# Patient Record
Sex: Female | Born: 1985 | Race: Black or African American | Hispanic: No | Marital: Single | Smoking: Never smoker
Health system: Southern US, Community
[De-identification: ages and names within clinical notes are randomized; demographics above are authoritative.]

## PROBLEM LIST (undated history)

## (undated) DIAGNOSIS — J45909 Unspecified asthma, uncomplicated: Secondary | ICD-10-CM

## (undated) HISTORY — DX: Unspecified asthma, uncomplicated: J45.909

---

## 2008-12-08 ENCOUNTER — Emergency Department (HOSPITAL_COMMUNITY): Admission: EM | Admit: 2008-12-08 | Discharge: 2008-12-09 | Payer: Self-pay | Admitting: Emergency Medicine

## 2009-01-18 ENCOUNTER — Emergency Department (HOSPITAL_COMMUNITY): Admission: EM | Admit: 2009-01-18 | Discharge: 2009-01-18 | Payer: Self-pay | Admitting: Emergency Medicine

## 2010-11-04 LAB — RAPID STREP SCREEN (MED CTR MEBANE ONLY): Streptococcus, Group A Screen (Direct): NEGATIVE

## 2010-11-12 ENCOUNTER — Emergency Department (HOSPITAL_COMMUNITY)
Admission: EM | Admit: 2010-11-12 | Discharge: 2010-11-12 | Disposition: A | Discharge status: Home / Self Care | Payer: Self-pay | Attending: Emergency Medicine | Admitting: Emergency Medicine

## 2010-11-12 ENCOUNTER — Emergency Department (HOSPITAL_COMMUNITY): Payer: Self-pay

## 2010-11-12 DIAGNOSIS — R000 Tachycardia, unspecified: Secondary | ICD-10-CM | POA: Insufficient documentation

## 2010-11-12 DIAGNOSIS — R059 Cough, unspecified: Secondary | ICD-10-CM | POA: Insufficient documentation

## 2010-11-12 DIAGNOSIS — R05 Cough: Secondary | ICD-10-CM | POA: Insufficient documentation

## 2010-11-12 DIAGNOSIS — IMO0001 Reserved for inherently not codable concepts without codable children: Secondary | ICD-10-CM | POA: Insufficient documentation

## 2010-11-12 DIAGNOSIS — J40 Bronchitis, not specified as acute or chronic: Secondary | ICD-10-CM | POA: Insufficient documentation

## 2014-05-13 ENCOUNTER — Ambulatory Visit (INDEPENDENT_AMBULATORY_CARE_PROVIDER_SITE_OTHER): Payer: BC Managed Care – PPO

## 2014-05-13 ENCOUNTER — Ambulatory Visit (INDEPENDENT_AMBULATORY_CARE_PROVIDER_SITE_OTHER): Payer: BC Managed Care – PPO | Admitting: Family Medicine

## 2014-05-13 VITALS — BP 132/79 | HR 85 | Temp 99.0°F | Resp 16 | Ht 68.0 in | Wt 194.2 lb

## 2014-05-13 DIAGNOSIS — R059 Cough, unspecified: Secondary | ICD-10-CM

## 2014-05-13 DIAGNOSIS — J45901 Unspecified asthma with (acute) exacerbation: Secondary | ICD-10-CM

## 2014-05-13 DIAGNOSIS — R05 Cough: Secondary | ICD-10-CM

## 2014-05-13 DIAGNOSIS — R062 Wheezing: Secondary | ICD-10-CM

## 2014-05-13 MED ORDER — ALBUTEROL SULFATE HFA 108 (90 BASE) MCG/ACT IN AERS
1.0000 | INHALATION_SPRAY | RESPIRATORY_TRACT | Status: DC | PRN
Start: 2014-05-13 — End: 2017-06-30

## 2014-05-13 MED ORDER — ALBUTEROL SULFATE (2.5 MG/3ML) 0.083% IN NEBU
2.5000 mg | INHALATION_SOLUTION | Freq: Once | RESPIRATORY_TRACT | Status: AC
  Administered 2014-05-13: 2.5 mg via RESPIRATORY_TRACT

## 2014-05-13 MED ORDER — PREDNISONE 20 MG PO TABS: 20.0000 mg | ORAL_TABLET | Freq: Every day | ORAL | Status: DC

## 2014-05-13 MED ORDER — AZITHROMYCIN 250 MG PO TABS: ORAL_TABLET | ORAL | Status: DC

## 2014-05-13 NOTE — Patient Instructions (Signed)
Start prednisone, zpak as discussed, and albuterol up to every 4 hours as needed. Return to the clinic or go to the nearest emergency room if any of your symptoms worsen or new symptoms occur.  Acute Bronchitis Bronchitis is inflammation of the airways that extend from the windpipe into the lungs (bronchi). The inflammation often causes mucus to develop. This leads to a cough, which is the most common symptom of bronchitis.  In acute bronchitis, the condition usually develops suddenly and goes away over time, usually in a couple weeks. Smoking, allergies, and asthma can make bronchitis worse. Repeated episodes of bronchitis may cause further lung problems.  CAUSES Acute bronchitis is most often caused by the same virus that causes a cold. The virus can spread from person to person (contagious) through coughing, sneezing, and touching contaminated objects. SIGNS AND SYMPTOMS   Cough.   Fever.   Coughing up mucus.   Body aches.   Chest congestion.   Chills.   Shortness of breath.   Sore throat.  DIAGNOSIS  Acute bronchitis is usually diagnosed through a physical exam. Your health care provider will also ask you questions about your medical history. Tests, such as chest X-rays, are sometimes done to rule out other conditions.  TREATMENT  Acute bronchitis usually goes away in a couple weeks. Oftentimes, no medical treatment is necessary. Medicines are sometimes given for relief of fever or cough. Antibiotic medicines are usually not needed but may be prescribed in certain situations. In some cases, an inhaler may be recommended to help reduce shortness of breath and control the cough. A cool mist vaporizer may also be used to help thin bronchial secretions and make it easier to clear the chest.  HOME CARE INSTRUCTIONS  Get plenty of rest.   Drink enough fluids to keep your urine clear or pale yellow (unless you have a medical condition that requires fluid restriction). Increasing  fluids may help thin your respiratory secretions (sputum) and reduce chest congestion, and it will prevent dehydration.   Take medicines only as directed by your health care provider.  If you were prescribed an antibiotic medicine, finish it all even if you start to feel better.  Avoid smoking and secondhand smoke. Exposure to cigarette smoke or irritating chemicals will make bronchitis worse. If you are a smoker, consider using nicotine gum or skin patches to help control withdrawal symptoms. Quitting smoking will help your lungs heal faster.   Reduce the chances of another bout of acute bronchitis by washing your hands frequently, avoiding people with cold symptoms, and trying not to touch your hands to your mouth, nose, or eyes.   Keep all follow-up visits as directed by your health care provider.  SEEK MEDICAL CARE IF: Your symptoms do not improve after 1 week of treatment.  SEEK IMMEDIATE MEDICAL CARE IF:  You develop an increased fever or chills.   You have chest pain.   You have severe shortness of breath.  You have bloody sputum.   You develop dehydration.  You faint or repeatedly feel like you are going to pass out.  You develop repeated vomiting.  You develop a severe headache. MAKE SURE YOU:   Understand these instructions.  Will watch your condition.  Will get help right away if you are not doing well or get worse. Document Released: 08/21/2004 Document Revised: 11/28/2013 Document Reviewed: 01/04/2013 [facility] Patient Information 2015 [place], [place]. This information is not intended to replace advice given to you by your health care provider. Make  sure you discuss any questions you have with your health care provider. Asthma Asthma is a recurring condition in which the airways tighten and narrow. Asthma can make it difficult to breathe. It can cause coughing, wheezing, and shortness of breath. Asthma episodes, also called asthma attacks, range from minor  to life-threatening. Asthma cannot be cured, but medicines and lifestyle changes can help control it. CAUSES Asthma is believed to be caused by inherited (genetic) and environmental factors, but its exact cause is unknown. Asthma may be triggered by allergens, lung infections, or irritants in the air. Asthma triggers are different for each person. Common triggers include:   Animal dander.  Dust mites.  Cockroaches.  Pollen from trees or grass.  Mold.  Smoke.  Air pollutants such as dust, household cleaners, hair sprays, aerosol sprays, paint fumes, strong chemicals, or strong odors.  Cold air, weather changes, and winds (which increase molds and pollens in the air).  Strong emotional expressions such as crying or laughing hard.  Stress.  Certain medicines (such as aspirin) or types of drugs (such as beta-blockers).  Sulfites in foods and drinks. Foods and drinks that may contain sulfites include dried fruit, potato chips, and sparkling grape juice.  Infections or inflammatory conditions such as the flu, a cold, or an inflammation of the nasal membranes (rhinitis).  Gastroesophageal reflux disease (GERD).  Exercise or strenuous activity. SYMPTOMS Symptoms may occur immediately after asthma is triggered or many hours later. Symptoms include:  Wheezing.  Excessive nighttime or early morning coughing.  Frequent or severe coughing with a common cold.  Chest tightness.  Shortness of breath. DIAGNOSIS  The diagnosis of asthma is made by a review of your medical history and a physical exam. Tests may also be performed. These may include:  Lung function studies. These tests show how much air you breathe in and out.  Allergy tests.  Imaging tests such as X-rays. TREATMENT  Asthma cannot be cured, but it can usually be controlled. Treatment involves identifying and avoiding your asthma triggers. It also involves medicines. There are 2 classes of medicine used for asthma  treatment:   Controller medicines. These prevent asthma symptoms from occurring. They are usually taken every day.  Reliever or rescue medicines. These quickly relieve asthma symptoms. They are used as needed and provide short-term relief. Your health care provider will help you create an asthma action plan. An asthma action plan is a written plan for managing and treating your asthma attacks. It includes a list of your asthma triggers and how they may be avoided. It also includes information on when medicines should be taken and when their dosage should be changed. An action plan may also involve the use of a device called a peak flow meter. A peak flow meter measures how well the lungs are working. It helps you monitor your condition. HOME CARE INSTRUCTIONS   Take medicines only as directed by your health care provider. Speak with your health care provider if you have questions about how or when to take the medicines.  Use a peak flow meter as directed by your health care provider. Record and keep track of readings.  Understand and use the action plan to help minimize or stop an asthma attack without needing to seek medical care.  Control your home environment in the following ways to help prevent asthma attacks:  Do not smoke. Avoid being exposed to secondhand smoke.  Change your heating and air conditioning filter regularly.  Limit your use of  fireplaces and wood stoves.  Get rid of pests (such as roaches and mice) and their droppings.  Throw away plants if you see mold on them.  Clean your floors and dust regularly. Use unscented cleaning products.  Try to have someone else vacuum for you regularly. Stay out of rooms while they are being vacuumed and for a short while afterward. If you vacuum, use a dust mask from a hardware store, a double-layered or microfilter vacuum cleaner bag, or a vacuum cleaner with a HEPA filter.  Replace carpet with wood, tile, or vinyl flooring. Carpet  can trap dander and dust.  Use allergy-proof pillows, mattress covers, and box spring covers.  Wash bed sheets and blankets every week in hot water and dry them in a dryer.  Use blankets that are made of polyester or cotton.  Clean bathrooms and kitchens with bleach. If possible, have someone repaint the walls in these rooms with mold-resistant paint. Keep out of the rooms that are being cleaned and painted.  Wash hands frequently. SEEK MEDICAL CARE IF:   You have wheezing, shortness of breath, or a cough even if taking medicine to prevent attacks.  The colored mucus you cough up (sputum) is thicker than usual.  Your sputum changes from clear or white to yellow, green, gray, or bloody.  You have any problems that may be related to the medicines you are taking (such as a rash, itching, swelling, or trouble breathing).  You are using a reliever medicine more than 2-3 times per week.  Your peak flow is still at 50-79% of your personal best after following your action plan for 1 hour.  You have a fever. SEEK IMMEDIATE MEDICAL CARE IF:   You seem to be getting worse and are unresponsive to treatment during an asthma attack.  You are short of breath even at rest.  You get short of breath when doing very little physical activity.  You have difficulty eating, drinking, or talking due to asthma symptoms.  You develop chest pain.  You develop a fast heartbeat.  You have a bluish color to your lips or fingernails.  You are light-headed, dizzy, or faint.  Your peak flow is less than 50% of your personal best. MAKE SURE YOU:   Understand these instructions.  Will watch your condition.  Will get help right away if you are not doing well or get worse. Document Released: 07/14/2005 Document Revised: 11/28/2013 Document Reviewed: 02/10/2013 [facility] Patient Information 2015 [place], [place]. This information is not intended to replace advice given to you by your health care  provider. Make sure you discuss any questions you have with your health care provider.

## 2014-05-13 NOTE — Progress Notes (Signed)
Subjective:  This chart was scribed for Stacy Staggers, MD by Jarvis Morgan, Medical Scribe. This patient was seen in Room 1 and the patient's care was started at 1:40 PM.    Patient ID: Stacy Shepard, female    DOB: 07-31-1985, 28 y.o.   MRN: [id number]  HPI HPI Comments: Stacy Shepard is a 28 y.o. female who presents to the [facility] complaining moderate, constant, congestion for 3 weeks. Pt states that the symptoms began to develop into a cough that has been gradually worsening. She states she has associated wheezing, shortness of breath, subjective fever, and chest wall and abdominal pain due to cough. She also had one episode of night sweats about 3 weeks ago. 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 No recent travel abroad. Pt has a h/o asthma and but states her albuterol inhaler prescription is not UTD.  She has not had any asthma symptoms in over 1 year. She used an albuterol inhaler about 4 hours ago for her symptoms. Checked her own BS several weeks ago and was at 99, No h/o DM or elevated BS. She denies any otalgia, nausea, vomiting, diarrhea.    There are no active problems to display for this patient.  Past Medical History  Diagnosis Date  . Asthma    History reviewed. No pertinent past surgical history. No Known Allergies Prior to Admission medications   Medication Sig Start Date End Date Taking? Authorizing Provider  Cholecalciferol (VITAMIN D-3) 5000 UNITS TABS Take 1 tablet by mouth daily.   Yes Historical Provider, MD   History   Social History  . Marital Status: Single    Spouse Name: N/A    Number of Children: N/A  . Years of Education: N/A   Occupational History  . Not on file.   Social History Main Topics  . Smoking status: Never Smoker   . Smokeless tobacco: Never Used  . Alcohol Use: 0.5 oz/week    1 drink(s) per week  . Drug Use: No  . Sexual Activity: Not on file   Other  Topics Concern  . Not on file   Social History Narrative  . No narrative on file    Review of Systems  Constitutional: Positive for fever (subjective) and diaphoresis (at night, 3 weeks ago; now resolved). Negative for chills.  HENT: Positive for congestion. Negative for ear pain.   Respiratory: Positive for cough, shortness of breath and wheezing.   Gastrointestinal: Positive for abdominal pain (due to cough). Negative for nausea, vomiting and diarrhea.  Musculoskeletal: Positive for myalgias.  All other systems reviewed and are negative.      Objective:   Physical Exam  Vitals reviewed. Constitutional: She is oriented to person, place, and time. She appears well-developed and well-nourished. No distress.  HENT:  Head: Normocephalic and atraumatic.  Right Ear: Hearing, tympanic membrane, external ear and ear canal normal.  Left Ear: Hearing, tympanic membrane, external ear and ear canal normal.  Nose: Nose normal.  Mouth/Throat: Oropharynx is clear and moist. No oropharyngeal exudate.  Eyes: Conjunctivae and EOM are normal. Pupils are equal, round, and reactive to light.  Cardiovascular: Normal rate, regular rhythm, normal heart sounds and intact distal pulses.   No murmur heard. Pulmonary/Chest: Effort normal. No respiratory distress. She has wheezes. She has no rhonchi.  Faint wheeze with slight decreased air flow. Speaking normal, no respiratory distress. No tachypnea   Neurological: She is alert  and oriented to person, place, and time.  Skin: Skin is warm and dry. No rash noted.  Psychiatric: She has a normal mood and affect. Her behavior is normal.     Filed Vitals:   05/13/14 1257  BP: 132/79  Pulse: 85  Temp: 99 F (37.2 C)  TempSrc: Oral  Resp: 16  Height: 5\' 8"  (1.727 m)  Weight: 194 lb 4 oz (88.111 kg)  SpO2: 97%   [facility] reading (PRIMARY) by  Dr. Neva Seat CXR no acute findings, slightly hyperinflated  Initial peak flow 270; post neb peak flow (2.5 mg  albuterol neb) was 460 with resolution of wheeze, lungs were clear with normal effort     Assessment & Plan:   [name] is a 28 y.o. female Cough - Plan: albuterol (PROVENTIL) (2.5 MG/3ML) 0.083% nebulizer solution 2.5 mg, DG Chest 2 View  Wheezing - Plan: albuterol (PROVENTIL) (2.5 MG/3ML) 0.083% nebulizer solution 2.5 mg, DG Chest 2 View  Asthmatic bronchitis, unspecified asthma severity, with acute exacerbation - Plan: albuterol (PROVENTIL) (2.5 MG/3ML) 0.083% nebulizer solution 2.5 mg, DG Chest 2 View, predniSONE (DELTASONE) 20 MG tablet, azithromycin (ZITHROMAX) 250 MG tablet, albuterol (PROVENTIL HFA;VENTOLIN HFA) 108 (90 BASE) MCG/ACT inhaler   initial URI with progression to LRTI/asthmatic bronchitis. Afebrile, reassuring CXR.   -prednisone 40mg  QD for 5 days.  Sed, rtc precautions.   -zpak #1  -albuterol inh Q4-6h prn #1Rx  - RTC/Er precautions.     Meds ordered this encounter  Medications  . Cholecalciferol (VITAMIN D-3) 5000 UNITS TABS    Sig: Take 1 tablet by mouth daily.  Marland Kitchen albuterol (PROVENTIL) (2.5 MG/3ML) 0.083% nebulizer solution 2.5 mg    Sig:   . predniSONE (DELTASONE) 20 MG tablet    Sig: Take 1 tablet (20 mg total) by mouth daily with breakfast.    Dispense:  10 tablet    Refill:  0  . azithromycin (ZITHROMAX) 250 MG tablet    Sig: Take 2 pills by mouth on day 1, then 1 pill by mouth per day on days 2 through 5.    Dispense:  6 tablet    Refill:  0  . albuterol (PROVENTIL HFA;VENTOLIN HFA) 108 (90 BASE) MCG/ACT inhaler    Sig: Inhale 1-2 puffs into the lungs every 4 (four) hours as needed for wheezing or shortness of breath.    Dispense:  1 Inhaler    Refill:  0   Patient Instructions  Start prednisone, zpak as discussed, and albuterol up to every 4 hours as needed. Return to the clinic or go to the nearest emergency room if any of your symptoms worsen or new symptoms occur.  Acute Bronchitis Bronchitis is inflammation of the airways that  extend from the windpipe into the lungs (bronchi). The inflammation often causes mucus to develop. This leads to a cough, which is the most common symptom of bronchitis.  In acute bronchitis, the condition usually develops suddenly and goes away over time, usually in a couple weeks. Smoking, allergies, and asthma can make bronchitis worse. Repeated episodes of bronchitis may cause further lung problems.  CAUSES Acute bronchitis is most often caused by the same virus that causes a cold. The virus can spread from person to person (contagious) through coughing, sneezing, and touching contaminated objects. SIGNS AND SYMPTOMS   Cough.   Fever.   Coughing up mucus.   Body aches.   Chest congestion.   Chills.   Shortness of breath.   Sore throat.  DIAGNOSIS  Acute bronchitis is usually diagnosed through a physical exam. Your health care provider will also ask you questions about your medical history. Tests, such as chest X-rays, are sometimes done to rule out other conditions.  TREATMENT  Acute bronchitis usually goes away in a couple weeks. Oftentimes, no medical treatment is necessary. Medicines are sometimes given for relief of fever or cough. Antibiotic medicines are usually not needed but may be prescribed in certain situations. In some cases, an inhaler may be recommended to help reduce shortness of breath and control the cough. A cool mist vaporizer may also be used to help thin bronchial secretions and make it easier to clear the chest.  HOME CARE INSTRUCTIONS  Get plenty of rest.   Drink enough fluids to keep your urine clear or pale yellow (unless you have a medical condition that requires fluid restriction). Increasing fluids may help thin your respiratory secretions (sputum) and reduce chest congestion, and it will prevent dehydration.   Take medicines only as directed by your health care provider.  If you were prescribed an antibiotic medicine, finish it all even if  you start to feel better.  Avoid smoking and secondhand smoke. Exposure to cigarette smoke or irritating chemicals will make bronchitis worse. If you are a smoker, consider using nicotine gum or skin patches to help control withdrawal symptoms. Quitting smoking will help your lungs heal faster.   Reduce the chances of another bout of acute bronchitis by washing your hands frequently, avoiding people with cold symptoms, and trying not to touch your hands to your mouth, nose, or eyes.   Keep all follow-up visits as directed by your health care provider.  SEEK MEDICAL CARE IF: Your symptoms do not improve after 1 week of treatment.  SEEK IMMEDIATE MEDICAL CARE IF:  You develop an increased fever or chills.   You have chest pain.   You have severe shortness of breath.  You have bloody sputum.   You develop dehydration.  You faint or repeatedly feel like you are going to pass out.  You develop repeated vomiting.  You develop a severe headache. MAKE SURE YOU:   Understand these instructions.  Will watch your condition.  Will get help right away if you are not doing well or get worse. Document Released: 08/21/2004 Document Revised: 11/28/2013 Document Reviewed: 01/04/2013 [facility] Patient Information 2015 [place], [place]. This information is not intended to replace advice given to you by your health care provider. Make sure you discuss any questions you have with your health care provider. Asthma Asthma is a recurring condition in which the airways tighten and narrow. Asthma can make it difficult to breathe. It can cause coughing, wheezing, and shortness of breath. Asthma episodes, also called asthma attacks, range from minor to life-threatening. Asthma cannot be cured, but medicines and lifestyle changes can help control it. CAUSES Asthma is believed to be caused by inherited (genetic) and environmental factors, but its exact cause is unknown. Asthma may be triggered by  allergens, lung infections, or irritants in the air. Asthma triggers are different for each person. Common triggers include:   Animal dander.  Dust mites.  Cockroaches.  Pollen from trees or grass.  Mold.  Smoke.  Air pollutants such as dust, household cleaners, hair sprays, aerosol sprays, paint fumes, strong chemicals, or strong odors.  Cold air, weather changes, and winds (which increase molds and pollens in the air).  Strong emotional expressions such as crying or laughing hard.  Stress.  Certain medicines (such as  aspirin) or types of drugs (such as beta-blockers).  Sulfites in foods and drinks. Foods and drinks that may contain sulfites include dried fruit, potato chips, and sparkling grape juice.  Infections or inflammatory conditions such as the flu, a cold, or an inflammation of the nasal membranes (rhinitis).  Gastroesophageal reflux disease (GERD).  Exercise or strenuous activity. SYMPTOMS Symptoms may occur immediately after asthma is triggered or many hours later. Symptoms include:  Wheezing.  Excessive nighttime or early morning coughing.  Frequent or severe coughing with a common cold.  Chest tightness.  Shortness of breath. DIAGNOSIS  The diagnosis of asthma is made by a review of your medical history and a physical exam. Tests may also be performed. These may include:  Lung function studies. These tests show how much air you breathe in and out.  Allergy tests.  Imaging tests such as X-rays. TREATMENT  Asthma cannot be cured, but it can usually be controlled. Treatment involves identifying and avoiding your asthma triggers. It also involves medicines. There are 2 classes of medicine used for asthma treatment:   Controller medicines. These prevent asthma symptoms from occurring. They are usually taken every day.  Reliever or rescue medicines. These quickly relieve asthma symptoms. They are used as needed and provide short-term relief. Your  health care provider will help you create an asthma action plan. An asthma action plan is a written plan for managing and treating your asthma attacks. It includes a list of your asthma triggers and how they may be avoided. It also includes information on when medicines should be taken and when their dosage should be changed. An action plan may also involve the use of a device called a peak flow meter. A peak flow meter measures how well the lungs are working. It helps you monitor your condition. HOME CARE INSTRUCTIONS   Take medicines only as directed by your health care provider. Speak with your health care provider if you have questions about how or when to take the medicines.  Use a peak flow meter as directed by your health care provider. Record and keep track of readings.  Understand and use the action plan to help minimize or stop an asthma attack without needing to seek medical care.  Control your home environment in the following ways to help prevent asthma attacks:  Do not smoke. Avoid being exposed to secondhand smoke.  Change your heating and air conditioning filter regularly.  Limit your use of fireplaces and wood stoves.  Get rid of pests (such as roaches and mice) and their droppings.  Throw away plants if you see mold on them.  Clean your floors and dust regularly. Use unscented cleaning products.  Try to have someone else vacuum for you regularly. Stay out of rooms while they are being vacuumed and for a short while afterward. If you vacuum, use a dust mask from a hardware store, a double-layered or microfilter vacuum cleaner bag, or a vacuum cleaner with a HEPA filter.  Replace carpet with wood, tile, or vinyl flooring. Carpet can trap dander and dust.  Use allergy-proof pillows, mattress covers, and box spring covers.  Wash bed sheets and blankets every week in hot water and dry them in a dryer.  Use blankets that are made of polyester or cotton.  Clean bathrooms  and kitchens with bleach. If possible, have someone repaint the walls in these rooms with mold-resistant paint. Keep out of the rooms that are being cleaned and painted.  Wash hands frequently. SEEK  MEDICAL CARE IF:   You have wheezing, shortness of breath, or a cough even if taking medicine to prevent attacks.  The colored mucus you cough up (sputum) is thicker than usual.  Your sputum changes from clear or white to yellow, green, gray, or bloody.  You have any problems that may be related to the medicines you are taking (such as a rash, itching, swelling, or trouble breathing).  You are using a reliever medicine more than 2-3 times per week.  Your peak flow is still at 50-79% of your personal best after following your action plan for 1 hour.  You have a fever. SEEK IMMEDIATE MEDICAL CARE IF:   You seem to be getting worse and are unresponsive to treatment during an asthma attack.  You are short of breath even at rest.  You get short of breath when doing very little physical activity.  You have difficulty eating, drinking, or talking due to asthma symptoms.  You develop chest pain.  You develop a fast heartbeat.  You have a bluish color to your lips or fingernails.  You are light-headed, dizzy, or faint.  Your peak flow is less than 50% of your personal best. MAKE SURE YOU:   Understand these instructions.  Will watch your condition.  Will get help right away if you are not doing well or get worse. Document Released: 07/14/2005 Document Revised: 11/28/2013 Document Reviewed: 02/10/2013 [facility] Patient Information 2015 [place], [place]. This information is not intended to replace advice given to you by your health care provider. Make sure you discuss any questions you have with your health care provider.   I personally performed the services described in this documentation, which was scribed in my presence. The recorded information has been reviewed and considered, and  addended by me as needed.

## 2016-01-20 IMAGING — CR DG CHEST 2V
2 series · 2 of 2 positions shown · non-contrast
Comparison: 11/12/2010

CLINICAL DATA: Complaining of moderate, constant, congestion for 3
weeks. Pt states that the symptoms began to develop into a cough
that has been gradually worsening. She states she has associated
wheezing, shortness of breath, subjective fever, and chest wall and
abdominal pain due to cough.

EXAM:
CHEST  2 VIEW

[PA]
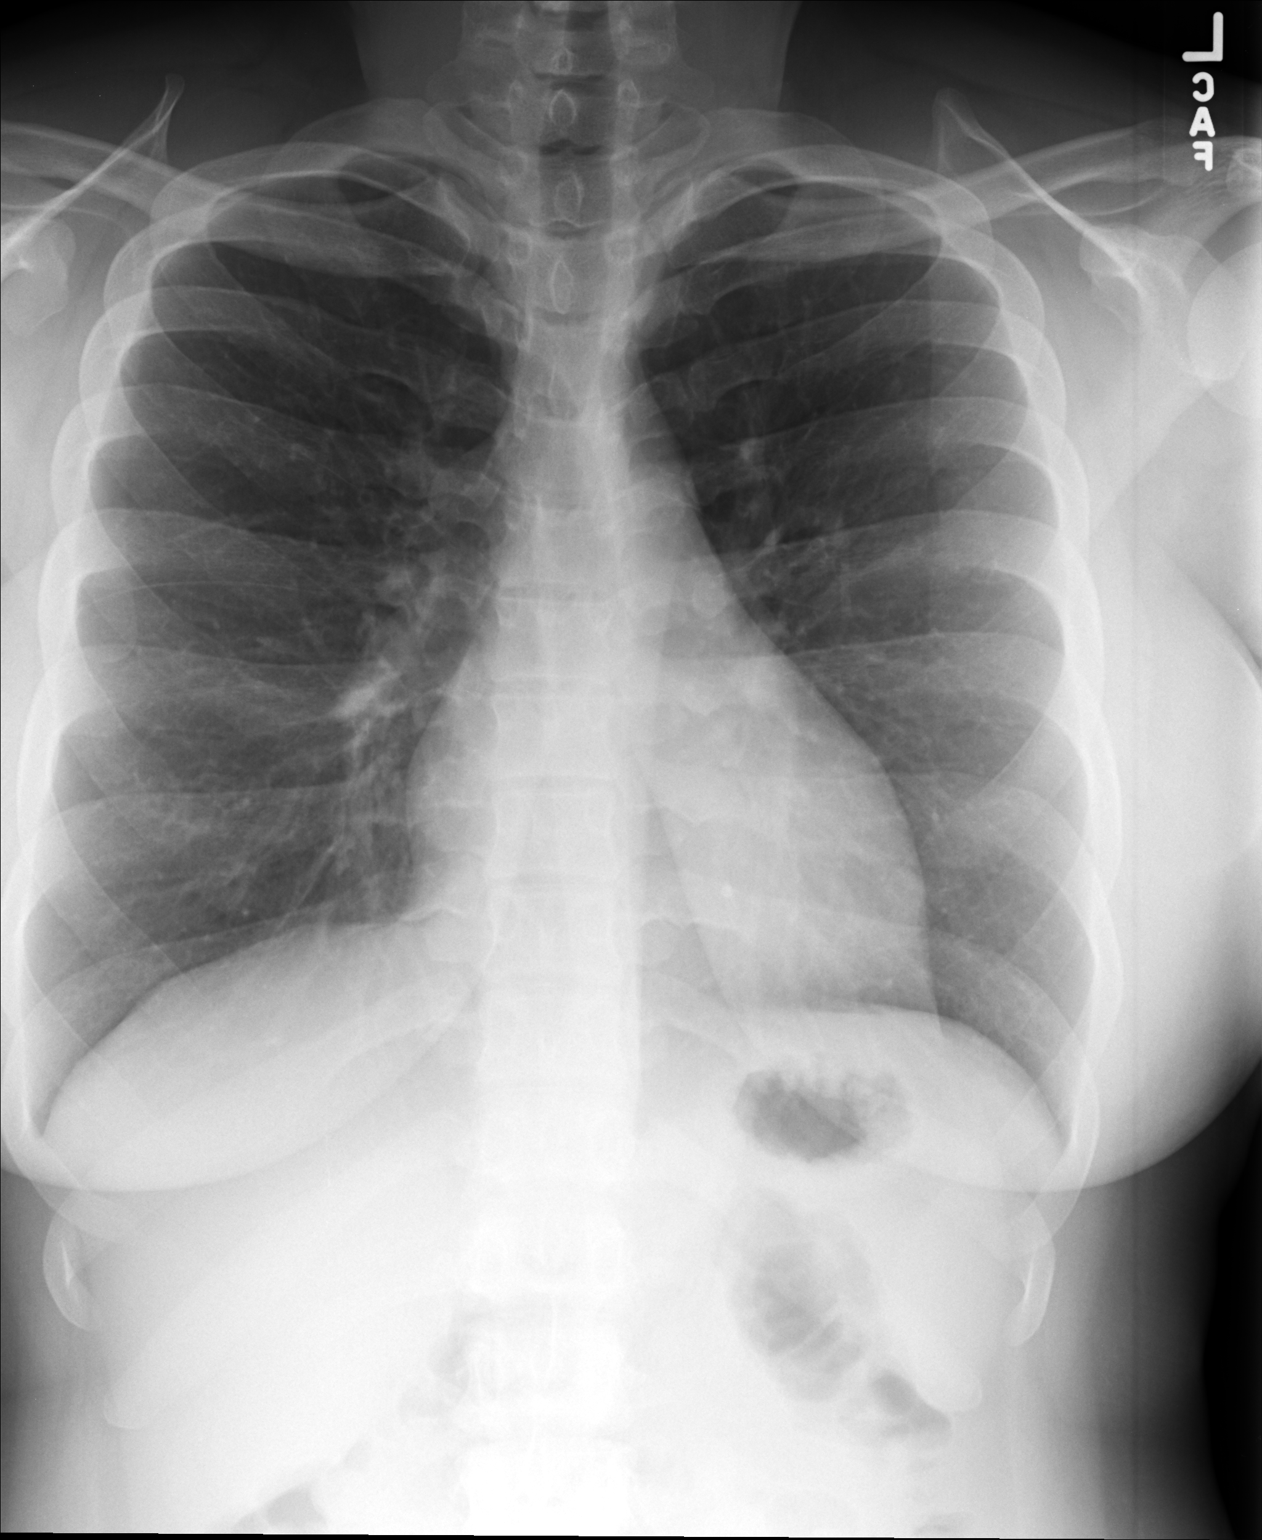

[lateral]
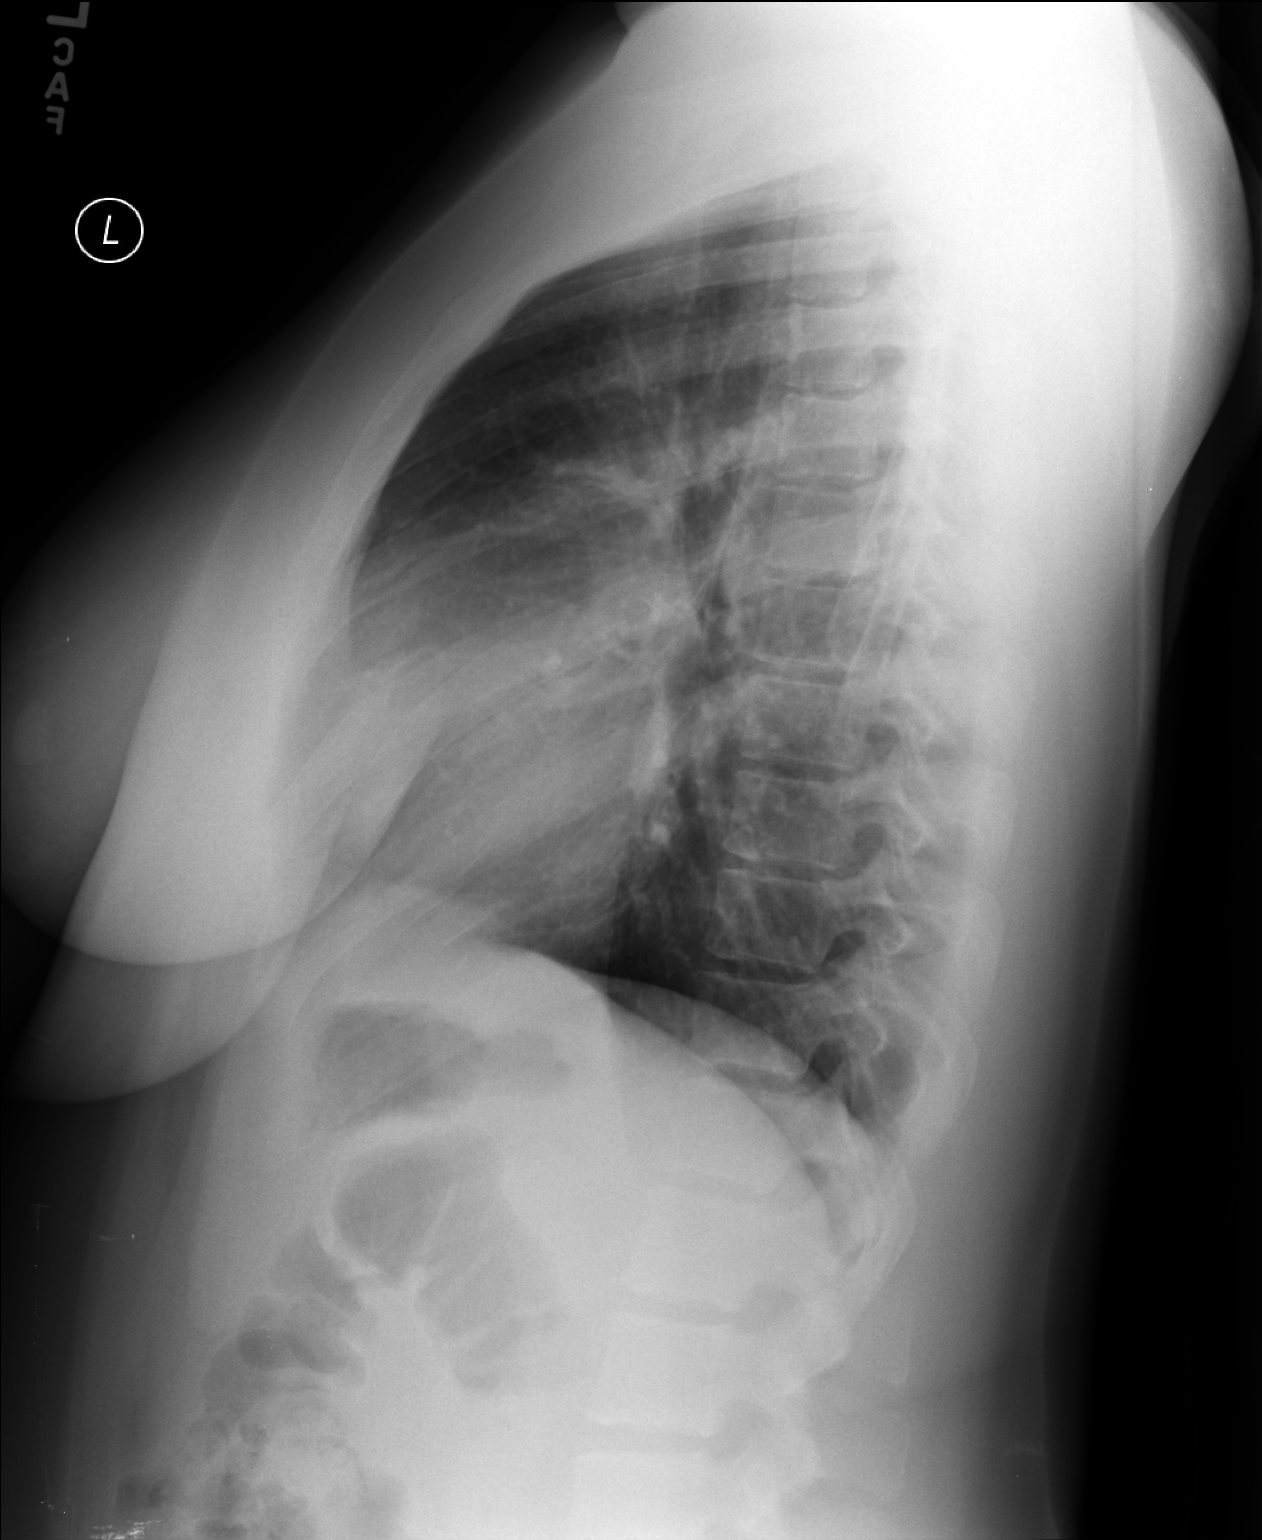

[2 of 2 positions shown; findings below may reference images not displayed]

FINDINGS: The heart size and mediastinal contours are within normal limits.
Both lungs are clear. The visualized skeletal structures are
unremarkable.
IMPRESSION: No active cardiopulmonary disease.

## 2016-02-03 ENCOUNTER — Encounter (HOSPITAL_BASED_OUTPATIENT_CLINIC_OR_DEPARTMENT_OTHER): Payer: Self-pay | Admitting: Emergency Medicine

## 2016-02-03 ENCOUNTER — Emergency Department (HOSPITAL_BASED_OUTPATIENT_CLINIC_OR_DEPARTMENT_OTHER)
Admission: EM | Admit: 2016-02-03 | Discharge: 2016-02-03 | Disposition: A | Discharge status: Home / Self Care | Payer: BLUE CROSS/BLUE SHIELD | Attending: Emergency Medicine | Admitting: Emergency Medicine

## 2016-02-03 DIAGNOSIS — H579 Unspecified disorder of eye and adnexa: Secondary | ICD-10-CM | POA: Diagnosis present

## 2016-02-03 DIAGNOSIS — H109 Unspecified conjunctivitis: Secondary | ICD-10-CM

## 2016-02-03 DIAGNOSIS — J45909 Unspecified asthma, uncomplicated: Secondary | ICD-10-CM | POA: Diagnosis not present

## 2016-02-03 MED ORDER — ERYTHROMYCIN 5 MG/GM OP OINT: TOPICAL_OINTMENT | Freq: Three times a day (TID) | OPHTHALMIC | Status: DC

## 2016-02-03 MED ORDER — LORATADINE 10 MG PO TABS
10.0000 mg | ORAL_TABLET | Freq: Once | ORAL | Status: AC
  Administered 2016-02-03: 10 mg via ORAL
  Filled 2016-02-03: qty 1

## 2016-02-03 MED ORDER — ERYTHROMYCIN 5 MG/GM OP OINT
TOPICAL_OINTMENT | Freq: Once | OPHTHALMIC | Status: AC
  Administered 2016-02-03: 22:00:00 via OPHTHALMIC
  Filled 2016-02-03: qty 3.5

## 2016-02-03 MED ORDER — HYDROCODONE-ACETAMINOPHEN 5-325 MG PO TABS: 1.0000 | ORAL_TABLET | Freq: Four times a day (QID) | ORAL | Status: DC | PRN

## 2016-02-03 MED ORDER — FLUORESCEIN SODIUM 1 MG OP STRP
ORAL_STRIP | OPHTHALMIC | Status: AC
  Administered 2016-02-03: 22:00:00
  Filled 2016-02-03: qty 1

## 2016-02-03 MED ORDER — LORATADINE 10 MG PO TABS: 10.0000 mg | ORAL_TABLET | Freq: Every day | ORAL | Status: DC

## 2016-02-03 MED ORDER — TETRACAINE HCL 0.5 % OP SOLN
2.0000 [drp] | Freq: Once | OPHTHALMIC | Status: AC
  Administered 2016-02-03: 2 [drp] via OPHTHALMIC
  Filled 2016-02-03: qty 4

## 2016-02-03 MED ORDER — FLUORESCEIN SODIUM 1 MG OP STRP
2.0000 | ORAL_STRIP | Freq: Once | OPHTHALMIC | Status: AC
  Administered 2016-02-03: 2 via OPHTHALMIC
  Filled 2016-02-03: qty 2

## 2016-02-03 NOTE — ED Notes (Signed)
Pt given d/c instructions as per chart. Rx x 3 with narc and tylenol prec. Verbalizes understanding. No questions.

## 2016-02-03 NOTE — ED Notes (Signed)
Cool wet washcloth given to patient to place across eyes for comfort while she rests.  Awaiting further EDP eval.

## 2016-02-03 NOTE — ED Provider Notes (Signed)
CSN: [id number]     Arrival date & time 02/03/16  1904 History  By signing my name below, I, Doreatha Martin, attest that this documentation has been prepared under the direction and in the presence of Marily Memos, MD. Electronically Signed: Doreatha Martin, ED Scribe. 02/03/2016. 7:55 PM.     Chief Complaint  Patient presents with  . Eye Problem   The history is provided by the patient. No language interpreter was used.   HPI Comments: Stacy Shepard is a 30 y.o. female who presents to the Emergency Department complaining of moderate right eye redness onset 3 days ago. Per pt, she picked an eyelash out of her right eye with tweezers before the onset of the redness and drainage in her right eye. Pt reports that the redness and drainage has currently spread to bilateral eyes. She denies recent trauma or injury to the eyes. She states that she was prescribed Cipro eye drops 2 days ago from her PCP that have provided no relief of the redness and minimal relief of the discharge. Per pt, after beginning the Cipro, she began to have right jaw paresthesia and stiffness that is painful when eating. Pt states that this paresthesia and stiffness has also spread to her left jaw today. Pt does not wear contact lenses. She denies visual changes, trouble swallowing. PCP is with [facility].   Past Medical History  Diagnosis Date  . Asthma    History reviewed. No pertinent past surgical history. Family History  Problem Relation Age of Onset  . Cancer Mother   . Diabetes Mother   . Hypertension Mother   . Hypertension Sister   . Cancer Brother   . Stroke Maternal Grandmother   . Hypertension Maternal Grandfather    Social History  Substance Use Topics  . Smoking status: Never Smoker   . Smokeless tobacco: Never Used  . Alcohol Use: 0.5 oz/week    1 drink(s) per week   OB History    No data available     Review of Systems  HENT: Negative for trouble swallowing.        +jaw stiffness  Eyes:  Positive for discharge and redness. Negative for visual disturbance.  All other systems reviewed and are negative.  Allergies  Review of patient's allergies indicates no known allergies.  Home Medications   Prior to Admission medications   Medication Sig Start Date End Date Taking? Authorizing Provider  albuterol (PROVENTIL HFA;VENTOLIN HFA) 108 (90 BASE) MCG/ACT inhaler Inhale 1-2 puffs into the lungs every 4 (four) hours as needed for wheezing or shortness of breath. 05/13/14   Shade Flood, MD  azithromycin (ZITHROMAX) 250 MG tablet Take 2 pills by mouth on day 1, then 1 pill by mouth per day on days 2 through 5. 05/13/14   Shade Flood, MD  Cholecalciferol (VITAMIN D-3) 5000 UNITS TABS Take 1 tablet by mouth daily.    Historical Provider, MD  erythromycin ophthalmic ointment Place into both eyes 3 (three) times daily. 02/03/16   Marily Memos, MD  HYDROcodone-acetaminophen (NORCO) 5-325 MG tablet Take 1-2 tablets by mouth every 6 (six) hours as needed for severe pain. 02/03/16   Marily Memos, MD  loratadine (CLARITIN) 10 MG tablet Take 1 tablet (10 mg total) by mouth daily. 02/03/16   Marily Memos, MD  predniSONE (DELTASONE) 20 MG tablet Take 1 tablet (20 mg total) by mouth daily with breakfast. 05/13/14   Shade Flood, MD   BP 135/89 mmHg  Pulse  72  Temp(Src) 99.1 F (37.3 C) (Oral)  Resp 18  Ht 5\' 7"  (1.702 m)  Wt 175 lb (79.379 kg)  BMI 27.40 kg/m2  SpO2 100%  LMP 01/20/2016 Physical Exam  Constitutional: She appears well-developed and well-nourished.  HENT:  Head: Normocephalic.  Eyes: Conjunctivae are normal.  Bilateral IOP 14  Cardiovascular: Normal rate.   Pulmonary/Chest: Effort normal. No respiratory distress.  Abdominal: She exhibits no distension.  Musculoskeletal: Normal range of motion.  Neurological: She is alert.  Skin: Skin is warm and dry.  Psychiatric: She has a normal mood and affect. Her behavior is normal.  Nursing note and vitals  reviewed.   ED Course  Procedures (including critical care time) DIAGNOSTIC STUDIES: Oxygen Saturation is 99% on RA, normal by my interpretation.    COORDINATION OF CARE: 7:52 PM Discussed treatment plan with pt at bedside which includes ophthalmology f/u and pt agreed to plan.   Labs Review Labs Reviewed - No data to display  Imaging Review No results found. I have personally reviewed and evaluated these images and lab results as part of my medical decision-making.   EKG Interpretation None      MDM   Final diagnoses:  Bilateral conjunctivitis    Bilateral conjunctivitis think that is either allergic or viral. Leg is less likely to be bacterial however we will switch over to erythromycin. No evidence of significant large abrasions on with plan. Tono-Pen is normal. No vision loss. Plan for close ophthalmology follow-up.  New Prescriptions: Discharge Medication List as of 02/03/2016  9:35 PM    START taking these medications   Details  erythromycin ophthalmic ointment Place into both eyes 3 (three) times daily., Starting 02/03/2016, Until Discontinued, Print    HYDROcodone-acetaminophen (NORCO) 5-325 MG tablet Take 1-2 tablets by mouth every 6 (six) hours as needed for severe pain., Starting 02/03/2016, Until Discontinued, Print    loratadine (CLARITIN) 10 MG tablet Take 1 tablet (10 mg total) by mouth daily., Starting 02/03/2016, Until Discontinued, Print         I have personally and contemperaneously reviewed labs and imaging and used in my decision making as above.   A medical screening exam was performed and I feel the patient has had an appropriate workup for their chief complaint at this time and likelihood of emergent condition existing is low and thus workup can continue on an outpatient basis.. Their vital signs are stable. They have been counseled on decision, discharge, follow up and which symptoms necessitate immediate return to the emergency department.  They  verbally stated understanding and agreement with plan and discharged in stable condition.    I personally performed the services described in this documentation, which was scribed in my presence. The recorded information has been reviewed and is accurate.    Marily MemosJason Pasha Gadison, MD 02/04/16 [phone]

## 2016-02-03 NOTE — ED Notes (Signed)
Pt in c/o bilateral eye redness and irritation. Treated with cipro drops without improvement. Ambulatory in NAD.

## 2016-05-30 ENCOUNTER — Ambulatory Visit (INDEPENDENT_AMBULATORY_CARE_PROVIDER_SITE_OTHER): Payer: BLUE CROSS/BLUE SHIELD | Admitting: Internal Medicine

## 2016-05-30 ENCOUNTER — Encounter: Payer: Self-pay | Admitting: Internal Medicine

## 2016-05-30 VITALS — BP 122/82 | HR 107 | Ht 68.5 in | Wt 177.0 lb

## 2016-05-30 DIAGNOSIS — E0590 Thyrotoxicosis, unspecified without thyrotoxic crisis or storm: Secondary | ICD-10-CM

## 2016-05-30 DIAGNOSIS — R5383 Other fatigue: Secondary | ICD-10-CM | POA: Diagnosis not present

## 2016-05-30 LAB — T4, FREE: Free T4: 0.85 ng/dL (ref 0.60–1.60)

## 2016-05-30 LAB — VITAMIN B12: VITAMIN B 12: 596 pg/mL (ref 211–911)

## 2016-05-30 LAB — T3, FREE: T3, Free: 5.3 pg/mL — ABNORMAL HIGH (ref 2.3–4.2)

## 2016-05-30 LAB — TSH: TSH: 0.78 u[IU]/mL (ref 0.35–4.50)

## 2016-05-30 NOTE — Progress Notes (Signed)
Patient ID: Stacy Shepard, female   DOB: 1985-08-03, 30 y.o.   MRN: [id number]    HPI  Stacy Shepard is a 30 y.o.-year-old female, referred by her PCP, Jarrett Sohoourtney Wharton, PA , for evaluation and management of  subclinical thyrotoxicosis.  She developed increased fatigue in the last year. She is working different shifts.   I reviewed pt's thyroid tests: 03/07/2016: TSH 0.28 (0.34-4.5), free T4 0.86 (0.61-1.12), total T3 143 (71-180), TPO antibodies 15 (0-34) 03/07/2014: TSH 0.57  Pt denies feeling nodules in neck, hoarseness, dysphagia/odynophagia, SOB with lying down; she c/o: - + fatigue - + Subjective hyperthermia, which is not new - no tremors - no anxiety - no palpitations - no hyperdefecation - no weight loss - no hair loss  She exercises 4 times a week: Cardio, weights. No decreased exercise tolerance.  Pt does not have a FH of thyroid ds. No FH of thyroid cancer. No h/o radiation tx to head or neck.  No seaweed or kelp, no recent contrast studies. No recent steroid use - last: 5 day taper beginning of the year. No herbal supplements. No Biotin use - was taking it >1 year ago. She is now vegan for last 2 month.  At the beginning of the year, she went to the weight loss clinic: B12 inj., Phentermine. Stopped these after 3 month.  Pt. also has a history of vitamin D def. (last 01/2016: 24.3), lipoma, asthma as a child.  ROS: Constitutional: See history of present illness Eyes: no blurry vision, no xerophthalmia ENT: no sore throat, no nodules palpated in neck, no dysphagia/odynophagia, no hoarseness Cardiovascular: no CP/SOB/palpitations/leg swelling Respiratory: no cough/SOB Gastrointestinal: no N/V/D/C Musculoskeletal: no muscle/joint aches Skin: no rashes Neurological: no tremors/numbness/tingling/dizziness Psychiatric: no depression/anxiety  Past Medical History:  Diagnosis Date  . Asthma    No past surgical history on file. Social History   Social  History  . Marital status: Single    Spouse name: N/A  . Number of children: 0   Occupational History  . LPN    Social History Main Topics  . Smoking status: Never Smoker  . Smokeless tobacco: Never Used  . Alcohol use 0.5 oz/week    1 drink(s) per week  . Drug use: No   Current Outpatient Prescriptions on File Prior to Visit  Medication Sig Dispense Refill  . albuterol (PROVENTIL HFA;VENTOLIN HFA) 108 (90 BASE) MCG/ACT inhaler Inhale 1-2 puffs into the lungs every 4 (four) hours as needed for wheezing or shortness of breath. 1 Inhaler 0  . azithromycin (ZITHROMAX) 250 MG tablet Take 2 pills by mouth on day 1, then 1 pill by mouth per day on days 2 through 5. (Patient not taking: Reported on 05/30/2016) 6 tablet 0  . Cholecalciferol (VITAMIN D-3) 5000 UNITS TABS Take 1 tablet by mouth daily.    Marland Kitchen. erythromycin ophthalmic ointment Place into both eyes 3 (three) times daily. (Patient not taking: Reported on 05/30/2016) 3.5 g 1  . HYDROcodone-acetaminophen (NORCO) 5-325 MG tablet Take 1-2 tablets by mouth every 6 (six) hours as needed for severe pain. (Patient not taking: Reported on 05/30/2016) 10 tablet 0  . loratadine (CLARITIN) 10 MG tablet Take 1 tablet (10 mg total) by mouth daily. (Patient not taking: Reported on 05/30/2016) 30 tablet 0  . predniSONE (DELTASONE) 20 MG tablet Take 1 tablet (20 mg total) by mouth daily with breakfast. (Patient not taking: Reported on 05/30/2016) 10 tablet 0   No current facility-administered medications on file prior to  visit.    No Known Allergies Family History  Problem Relation Age of Onset  . Cancer Mother   . Diabetes Mother   . Hypertension Mother   . Hypertension Sister   . Cancer Brother   . Stroke Maternal Grandmother   . Hypertension Maternal Grandfather     PE: BP 122/82 (BP Location: Left Arm, Patient Position: Sitting)   Pulse (!) 107   Ht 5' 8.5" (1.74 m)   Wt 177 lb (80.3 kg)   LMP 05/11/2016   SpO2 99%   BMI 26.52 kg/m  Wt  Readings from Last 3 Encounters:  05/30/16 177 lb (80.3 kg)  02/03/16 175 lb (79.4 kg)  05/13/14 194 lb 4 oz (88.1 kg)   Constitutional: overweight, in NAD Eyes: PERRLA, EOMI, no exophthalmos, no lid lag, no stare ENT: moist mucous membranes, + Slight symmetric thyromegaly, no thyroid bruits, no cervical lymphadenopathy Cardiovascular: RRR, No MRG Respiratory: CTA B Gastrointestinal: abdomen soft, NT, ND, BS+ Musculoskeletal: no deformities, strength intact in all 4 Skin: moist, warm, no rashes Neurological: no tremor with outstretched hands, DTR normal in all 4  ASSESSMENT: 1. Mild subclinical thyrotoxicosis  2. Fatigue  PLAN:  1. Patient with a recently found low TSH, without thyrotoxic sxs: No weight loss, increased heat intolerance (she has a mild degree of this, but not new), hyperdefecation, palpitations, anxiety. She is fatigued, though, and wondering whether this is related to her abnormal thyroid tests. I explained that she has a mild degree of subclinical thyrotoxicosis, which is unlikely to cause significant fatigue, but not impossible.  - she does not appear to have exogenous causes for the low TSH.  - We discussed that possible causes of thyrotoxicosis are:  Graves ds   Thyroiditis toxic multinodular goiter/ toxic adenoma (I cannot feel nodules at palpation of her thyroid). - I suggested that we check the TSH, fT3 and fT4 and also add thyroid stimulating antibodies to screen for Graves' disease.  - If the tests remain abnormal, we may need an uptake and scan to differentiate between the 3 above possible etiologies  - we discussed about possible modalities of treatment for the above conditions, to include methimazole use, radioactive iodine ablation or (last resort) surgery. - we might need to do thyroid ultrasound depending on the results of the uptake and scan (if a cold nodule is present) - She is tachycardic today, but she just drank an energy drink - I advised her to  join my chart to communicate easier - RTC in 4 months, but likely sooner for repeat labs  2. Fatigue - She has a history of vitamin D deficiency, which improved, but not to normal, at last check 4 months ago. She was started on 2000 units vitamin D daily. - She is a vegan for the last 2 months. We'll check a B12 level. She has been on B12 injections at the beginning of the year for weight loss, apparently.  Component     Latest Ref Rng & Units 05/30/2016  T4,Free(Direct)     0.60 - 1.60 ng/dL 1.610.85  Triiodothyronine,Free,Serum     2.3 - 4.2 pg/mL 5.3 (H)  TSH     0.35 - 4.50 uIU/mL 0.78  TSI     <140 % baseline 154 (H)  Vitamin B12     211 - 911 pg/mL 596   Normal TSH and free T4 was slightly elevated free T3. TSI's only mildly elevated. This picture could be consistent with resolving thyroiditis. No intervention is  needed for now, however, I would like to repeat her TFTs in 2 months. B12 vitamin normal.  Carlus Pavlov, MD PhD [facility]

## 2016-05-30 NOTE — Patient Instructions (Signed)
Please stop at the lab.  Please come back for a follow-up appointment in 4 months.   

## 2016-06-04 LAB — THYROID STIMULATING IMMUNOGLOBULIN: TSI: 154 %{baseline} — AB (ref <140)

## 2016-09-26 ENCOUNTER — Telehealth: Payer: Self-pay | Admitting: Internal Medicine

## 2016-09-26 ENCOUNTER — Ambulatory Visit: Payer: BLUE CROSS/BLUE SHIELD | Admitting: Internal Medicine

## 2016-09-26 NOTE — Telephone Encounter (Signed)
3mo

## 2016-09-26 NOTE — Telephone Encounter (Signed)
Patient no showed today's appt. Please advise on how to follow up. °A. No follow up necessary. °B. Follow up urgent. Contact patient immediately. °C. Follow up necessary. Contact patient and schedule visit in ___ days. °D. Follow up advised. Contact patient and schedule visit in ____weeks. ° °

## 2016-09-29 NOTE — Telephone Encounter (Signed)
Called patient to reschedule appt

## 2016-11-14 ENCOUNTER — Ambulatory Visit (INDEPENDENT_AMBULATORY_CARE_PROVIDER_SITE_OTHER): Payer: BLUE CROSS/BLUE SHIELD | Admitting: Family Medicine

## 2016-11-14 ENCOUNTER — Encounter: Payer: Self-pay | Admitting: Family Medicine

## 2016-11-14 VITALS — BP 122/60 | HR 70 | Temp 99.0°F | Resp 18 | Ht 68.0 in | Wt 181.8 lb

## 2016-11-14 DIAGNOSIS — Z1389 Encounter for screening for other disorder: Secondary | ICD-10-CM | POA: Diagnosis not present

## 2016-11-14 DIAGNOSIS — Z130 Encounter for screening for diseases of the blood and blood-forming organs and certain disorders involving the immune mechanism: Secondary | ICD-10-CM | POA: Diagnosis not present

## 2016-11-14 DIAGNOSIS — Z0000 Encounter for general adult medical examination without abnormal findings: Secondary | ICD-10-CM

## 2016-11-14 DIAGNOSIS — Z113 Encounter for screening for infections with a predominantly sexual mode of transmission: Secondary | ICD-10-CM

## 2016-11-14 DIAGNOSIS — E0590 Thyrotoxicosis, unspecified without thyrotoxic crisis or storm: Secondary | ICD-10-CM | POA: Diagnosis not present

## 2016-11-14 DIAGNOSIS — Z136 Encounter for screening for cardiovascular disorders: Secondary | ICD-10-CM

## 2016-11-14 DIAGNOSIS — Z1383 Encounter for screening for respiratory disorder NEC: Secondary | ICD-10-CM | POA: Diagnosis not present

## 2016-11-14 LAB — POCT URINALYSIS DIP (MANUAL ENTRY)
Bilirubin, UA: NEGATIVE
Blood, UA: NEGATIVE
Glucose, UA: NEGATIVE mg/dL
Ketones, POC UA: NEGATIVE mg/dL
Nitrite, UA: NEGATIVE
Spec Grav, UA: >=1.03 — AB (ref 1.010–1.025)
Urobilinogen, UA: 1 E.U./dL
pH, UA: 6 (ref 5.0–8.0)

## 2016-11-14 LAB — POC MICROSCOPIC URINALYSIS (UMFC): Mucus: ABSENT

## 2016-11-14 NOTE — Progress Notes (Signed)
Subjective:  By signing my name below, I, Stacy Shepard, attest that this documentation has been prepared under the direction and in the presence of Stacy Sorenson, MD Electronically Signed: Charline Bills, ED Scribe 11/14/2016 at 8:49 AM.   Patient ID: Stacy Shepard, female    DOB: [birth year], 31 y.o.   MRN: [id number]  Chief Complaint  Patient presents with  . Employment Physical   HPI  Stacy Shepard is a 31 y.o. female who presents to [facility] Care at [facility] for an employment physical. Pt is fasting at this visit. PCP is with [facility] on [address]. Pt has a h/o mild overactive thyroid and recently discovered she was 6.[redacted] weeks pregnant. She last had labs drawn in 06/2016; states her thyroid levels had improved by changing her diet. Pt denies nausea or vomiting in the mornings. She has noticed some intermittent white vaginal discharge for a while but denies vaginal itching. She reports occasioanl breast tenderness but denies discharge or new lumps. She is taking a prenatal vitamin. Pt's last tetanus is unknown.  Past Medical History:  Diagnosis Date  . Asthma    Current Outpatient Prescriptions on File Prior to Visit  Medication Sig Dispense Refill  . albuterol (PROVENTIL HFA;VENTOLIN HFA) 108 (90 BASE) MCG/ACT inhaler Inhale 1-2 puffs into the lungs every 4 (four) hours as needed for wheezing or shortness of breath. 1 Inhaler 0   No current facility-administered medications on file prior to visit.    No Known Allergies  No past surgical history on file. Family History  Problem Relation Age of Onset  . Cancer Mother   . Diabetes Mother   . Hypertension Mother   . Hypertension Sister   . Cancer Brother   . Stroke Maternal Grandmother   . Hypertension Maternal Grandfather    Social History   Social History  . Marital status: Single    Spouse name: N/A  . Number of children: N/A  . Years of education: N/A   Social History Main Topics  . Smoking status:  Never Smoker  . Smokeless tobacco: Never Used  . Alcohol use 0.5 oz/week    1 drink(s) per week  . Drug use: No  . Sexual activity: Not Asked   Other Topics Concern  . None   Social History Narrative  . None   Depression screen [facility] 2/9 11/14/2016  Decreased Interest 0  Down, Depressed, Hopeless 0  PHQ - 2 Score 0    There is no immunization history on file for this patient.  Review of Systems  Respiratory: Positive for wheezing.   Gastrointestinal: Negative for nausea and vomiting.  Allergic/Immunologic: Positive for environmental allergies.  All other systems reviewed and are negative.     Objective:   Physical Exam  Constitutional: She is oriented to person, place, and time. She appears well-developed and well-nourished. No distress.  HENT:  Head: Normocephalic and atraumatic.  Right Ear: Tympanic membrane normal.  Left Ear: Tympanic membrane normal.  Nose: Nose normal.  Mouth/Throat: Oropharynx is clear and moist.  Eyes: Conjunctivae and EOM are normal.  Neck: Neck supple. No tracheal deviation present.  Thyroid feels symmetrically enlarged  Cardiovascular: Normal rate, regular rhythm, S1 normal, S2 normal and normal heart sounds.   Pulses:      Dorsalis pedis pulses are 2+ on the right side, and 2+ on the left side.  Pulmonary/Chest: Effort normal and breath sounds normal. No respiratory distress.  Abdominal: Bowel sounds are normal.  Musculoskeletal: Normal range  of motion.  No LE edema.  Lymphadenopathy:    She has no cervical adenopathy.  Neurological: She is alert and oriented to person, place, and time.  Skin: Skin is warm and dry.  Psychiatric: She has a normal mood and affect. Her behavior is normal.  Nursing note and vitals reviewed.  BP 122/60   Pulse 70   Temp 99 F (37.2 C) (Oral)   Resp 18   Ht  (1.727 m)   Wt 181 lb 12.8 oz (82.5 kg)   LMP 05/11/2016   SpO2 100%   BMI 27.64 kg/m    Results for orders placed or performed in visit  on 11/14/16  POCT urinalysis dipstick  Result Value Ref Range   Color, UA yellow yellow   Clarity, UA cloudy (A) clear   Glucose, UA negative negative mg/dL   Bilirubin, UA negative negative   Ketones, POC UA negative negative mg/dL   Spec Grav, UA >=1.027 (A) 1.010 - 1.025   Blood, UA negative negative   pH, UA 6.0 5.0 - 8.0   Protein Ur, POC trace (A) negative mg/dL   Urobilinogen, UA 1.0 0.2 or 1.0 E.U./dL   Nitrite, UA Negative Negative   Leukocytes, UA Trace (A) Negative  POCT Microscopic Urinalysis ([facility])  Result Value Ref Range   WBC,UR,HPF,POC Few (A) None WBC/hpf   RBC,UR,HPF,POC None None RBC/hpf   Bacteria Many (A) None, Too numerous to count   Mucus Absent Absent   Epithelial Cells, UR Per Microscopy Moderate (A) None, Too numerous to count cells/hpf      Assessment & Plan:   1. Annual physical exam   2. Routine screening for STI (sexually transmitted infection)   3. Screening for cardiovascular, respiratory, and genitourinary diseases   4. Screening for deficiency anemia   5. Subclinical hyperthyroidism     Orders Placed This Encounter  Procedures  . GC/Chlamydia Probe Amp  . Thyroid Panel With TSH  . CBC  . Comprehensive metabolic panel    Order Specific Question:   Has the patient fasted?    Answer:   Yes  . Lipid panel    Order Specific Question:   Has the patient fasted?    Answer:   Yes  . HIV antibody  . HCV Ab w/Rflx to Verification  . RPR  . Interpretation:  . Care order/instruction:    Scheduling Instructions:     Complete orders, AVS and go.  Marland Kitchen POCT urinalysis dipstick  . POCT Microscopic Urinalysis ([facility])    Meds ordered this encounter  Medications  . Prenatal Vit-Fe Fumarate-FA (PRENATAL MULTIVITAMIN) TABS tablet    Sig: Take 1 tablet by mouth daily at 12 noon.    I personally performed the services described in this documentation, which was scribed in my presence. The recorded information has been reviewed and considered, and  addended by me as needed.   Stacy Shepard, M.D.  Primary Care at [facility] [address] [place], [place] [address] [phone] phone [phone] fax  11/23/16 10:35 PM

## 2016-11-14 NOTE — Patient Instructions (Addendum)
   IF you received an x-ray today, you will receive an invoice from [facility]. Please contact [facility] at [phone] with questions or concerns regarding your invoice.   IF you received labwork today, you will receive an invoice from [facility]. Please contact [facility] at [phone] with questions or concerns regarding your invoice.   Our billing staff will not be able to assist you with questions regarding bills from these companies.  You will be contacted with the lab results as soon as they are available. The fastest way to get your results is to activate your My Chart account. Instructions are located on the last page of this paperwork. If you have not heard from us regarding the results in 2 weeks, please contact this office.     First Trimester of Pregnancy The first trimester of pregnancy is from week 1 until the end of week 13 (months 1 through 3). A week after a sperm fertilizes an egg, the egg will implant on the wall of the uterus. This embryo will begin to develop into a baby. Genes from you and your partner will form the baby. The female genes will determine whether the baby will be a boy or a girl. At 6-8 weeks, the eyes and face will be formed, and the heartbeat can be seen on ultrasound. At the end of 12 weeks, all the baby's organs will be formed. Now that you are pregnant, you will want to do everything you can to have a healthy baby. Two of the most important things are to get good prenatal care and to follow your health care provider's instructions. Prenatal care is all the medical care you receive before the baby's birth. This care will help prevent, find, and treat any problems during the pregnancy and childbirth. Body changes during your first trimester Your body goes through many changes during pregnancy. The changes vary from woman to woman.  You may gain or lose a couple of pounds at first.  You may feel sick to your stomach (nauseous) and you  may throw up (vomit). If the vomiting is uncontrollable, call your health care provider.  You may tire easily.  You may develop headaches that can be relieved by medicines. All medicines should be approved by your health care provider.  You may urinate more often. Painful urination may mean you have a bladder infection.  You may develop heartburn as a result of your pregnancy.  You may develop constipation because certain hormones are causing the muscles that push stool through your intestines to slow down.  You may develop hemorrhoids or swollen veins (varicose veins).  Your breasts may begin to grow larger and become tender. Your nipples may stick out more, and the tissue that surrounds them (areola) may become darker.  Your gums may bleed and may be sensitive to brushing and flossing.  Dark spots or blotches (chloasma, mask of pregnancy) may develop on your face. This will likely fade after the baby is born.  Your menstrual periods will stop.  You may have a loss of appetite.  You may develop cravings for certain kinds of food.  You may have changes in your emotions from day to day, such as being excited to be pregnant or being concerned that something may go wrong with the pregnancy and baby.  You may have more vivid and strange dreams.  You may have changes in your hair. These can include thickening of your hair, rapid growth, and changes in texture. Some women   also have hair loss during or after pregnancy, or hair that feels dry or thin. Your hair will most likely return to normal after your baby is born.  What to expect at prenatal visits During a routine prenatal visit:  You will be weighed to make sure you and the baby are growing normally.  Your blood pressure will be taken.  Your abdomen will be measured to track your baby's growth.  The fetal heartbeat will be listened to between weeks 10 and 14 of your pregnancy.  Test results from any previous visits will be  discussed.  Your health care provider may ask you:  How you are feeling.  If you are feeling the baby move.  If you have had any abnormal symptoms, such as leaking fluid, bleeding, severe headaches, or abdominal cramping.  If you are using any tobacco products, including cigarettes, chewing tobacco, and electronic cigarettes.  If you have any questions.  Other tests that may be performed during your first trimester include:  Blood tests to find your blood type and to check for the presence of any previous infections. The tests will also be used to check for low iron levels (anemia) and protein on red blood cells (Rh antibodies). Depending on your risk factors, or if you previously had diabetes during pregnancy, you may have tests to check for high blood sugar that affects pregnant women (gestational diabetes).  Urine tests to check for infections, diabetes, or protein in the urine.  An ultrasound to confirm the proper growth and development of the baby.  Fetal screens for spinal cord problems (spina bifida) and Down syndrome.  HIV (human immunodeficiency virus) testing. Routine prenatal testing includes screening for HIV, unless you choose not to have this test.  You may need other tests to make sure you and the baby are doing well.  Follow these instructions at home: Medicines  Follow your health care provider's instructions regarding medicine use. Specific medicines may be either safe or unsafe to take during pregnancy.  Take a prenatal vitamin that contains at least 600 micrograms (mcg) of folic acid.  If you develop constipation, try taking a stool softener if your health care provider approves. Eating and drinking  Eat a balanced diet that includes fresh fruits and vegetables, whole grains, good sources of protein such as meat, eggs, or tofu, and low-fat dairy. Your health care provider will help you determine the amount of weight gain that is right for you.  Avoid raw  meat and uncooked cheese. These carry germs that can cause birth defects in the baby.  Eating four or five small meals rather than three large meals a day may help relieve nausea and vomiting. If you start to feel nauseous, eating a few soda crackers can be helpful. Drinking liquids between meals, instead of during meals, also seems to help ease nausea and vomiting.  Limit foods that are high in fat and processed sugars, such as fried and sweet foods.  To prevent constipation: ? Eat foods that are high in fiber, such as fresh fruits and vegetables, whole grains, and beans. ? Drink enough fluid to keep your urine clear or pale yellow. Activity  Exercise only as directed by your health care provider. Most women can continue their usual exercise routine during pregnancy. Try to exercise for 30 minutes at least 5 days a week. Exercising will help you: ? Control your weight. ? Stay in shape. ? Be prepared for labor and delivery.  Experiencing pain or cramping in   the lower abdomen or lower back is a good sign that you should stop exercising. Check with your health care provider before continuing with normal exercises.  Try to avoid standing for long periods of time. Move your legs often if you must stand in one place for a long time.  Avoid heavy lifting.  Wear low-heeled shoes and practice good posture.  You may continue to have sex unless your health care provider tells you not to. Relieving pain and discomfort  Wear a good support bra to relieve breast tenderness.  Take warm sitz baths to soothe any pain or discomfort caused by hemorrhoids. Use hemorrhoid cream if your health care provider approves.  Rest with your legs elevated if you have leg cramps or low back pain.  If you develop varicose veins in your legs, wear support hose. Elevate your feet for 15 minutes, 3-4 times a day. Limit salt in your diet. Prenatal care  Schedule your prenatal visits by the twelfth week of pregnancy.  They are usually scheduled monthly at first, then more often in the last 2 months before delivery.  Write down your questions. Take them to your prenatal visits.  Keep all your prenatal visits as told by your health care provider. This is important. Safety  Wear your seat belt at all times when driving.  Make a list of emergency phone numbers, including numbers for family, friends, the hospital, and police and fire departments. General instructions  Ask your health care provider for a referral to a local prenatal education class. Begin classes no later than the beginning of month 6 of your pregnancy.  Ask for help if you have counseling or nutritional needs during pregnancy. Your health care provider can offer advice or refer you to specialists for help with various needs.  Do not use hot tubs, steam rooms, or saunas.  Do not douche or use tampons or scented sanitary pads.  Do not cross your legs for long periods of time.  Avoid cat litter boxes and soil used by cats. These carry germs that can cause birth defects in the baby and possibly loss of the fetus by miscarriage or stillbirth.  Avoid all smoking, herbs, alcohol, and medicines not prescribed by your health care provider. Chemicals in these products affect the formation and growth of the baby.  Do not use any products that contain nicotine or tobacco, such as cigarettes and e-cigarettes. If you need help quitting, ask your health care provider. You may receive counseling support and other resources to help you quit.  Schedule a dentist appointment. At home, brush your teeth with a soft toothbrush and be gentle when you floss. Contact a health care provider if:  You have dizziness.  You have mild pelvic cramps, pelvic pressure, or nagging pain in the abdominal area.  You have persistent nausea, vomiting, or diarrhea.  You have a bad smelling vaginal discharge.  You have pain when you urinate.  You notice increased  swelling in your face, hands, legs, or ankles.  You are exposed to fifth disease or chickenpox.  You are exposed to German measles (rubella) and have never had it. Get help right away if:  You have a fever.  You are leaking fluid from your vagina.  You have spotting or bleeding from your vagina.  You have severe abdominal cramping or pain.  You have rapid weight gain or loss.  You vomit blood or material that looks like coffee grounds.  You develop a severe headache.  You   have shortness of breath.  You have any kind of trauma, such as from a fall or a car accident. Summary  The first trimester of pregnancy is from week 1 until the end of week 13 (months 1 through 3).  Your body goes through many changes during pregnancy. The changes vary from woman to woman.  You will have routine prenatal visits. During those visits, your health care provider will examine you, discuss any test results you may have, and talk with you about how you are feeling. This information is not intended to replace advice given to you by your health care provider. Make sure you discuss any questions you have with your health care provider. Document Released: 07/08/2001 Document Revised: 06/25/2016 Document Reviewed: 06/25/2016 Elsevier Interactive Patient Education  2017 [employer]   

## 2016-11-15 LAB — HCV AB W/RFLX TO VERIFICATION: HCV Ab: <0.1 s/co ratio (ref 0.0–0.9)

## 2016-11-15 LAB — CBC
HEMATOCRIT: 31.9 % — AB (ref 34.0–46.6)
Hemoglobin: 11.2 g/dL (ref 11.1–15.9)
MCH: 24.2 pg — ABNORMAL LOW (ref 26.6–33.0)
MCHC: 35.1 g/dL (ref 31.5–35.7)
MCV: 69 fL — AB (ref 79–97)
Platelets: 219 10*3/uL (ref 150–379)
RBC: 4.63 x10E6/uL (ref 3.77–5.28)
RDW: 14.7 % (ref 12.3–15.4)
WBC: 5 10*3/uL (ref 3.4–10.8)

## 2016-11-15 LAB — COMPREHENSIVE METABOLIC PANEL
A/G RATIO: 1.6 (ref 1.2–2.2)
ALT: 13 IU/L (ref 0–32)
AST: 14 IU/L (ref 0–40)
Albumin: 4.4 g/dL (ref 3.5–5.5)
Alkaline Phosphatase: 23 IU/L — ABNORMAL LOW (ref 39–117)
BUN / CREAT RATIO: 14 (ref 9–23)
BUN: 9 mg/dL (ref 6–20)
Bilirubin Total: 1.4 mg/dL — ABNORMAL HIGH (ref 0.0–1.2)
CALCIUM: 9.5 mg/dL (ref 8.7–10.2)
CO2: 22 mmol/L (ref 18–29)
Chloride: 100 mmol/L (ref 96–106)
Creatinine, Ser: 0.66 mg/dL (ref 0.57–1.00)
GFR calc Af Amer: 139 mL/min/{1.73_m2} (ref >59)
GFR, EST NON AFRICAN AMERICAN: 121 mL/min/{1.73_m2} (ref >59)
GLOBULIN, TOTAL: 2.7 g/dL (ref 1.5–4.5)
Glucose: 71 mg/dL (ref 65–99)
POTASSIUM: 3.5 mmol/L (ref 3.5–5.2)
SODIUM: 136 mmol/L (ref 134–144)
TOTAL PROTEIN: 7.1 g/dL (ref 6.0–8.5)

## 2016-11-15 LAB — HIV ANTIBODY (ROUTINE TESTING W REFLEX): HIV Screen 4th Generation wRfx: NONREACTIVE

## 2016-11-15 LAB — THYROID PANEL WITH TSH
Free Thyroxine Index: 1.7 (ref 1.2–4.9)
T3 Uptake Ratio: 24 % (ref 24–39)
T4, Total: 6.9 ug/dL (ref 4.5–12.0)
TSH: 0.576 u[IU]/mL (ref 0.450–4.500)

## 2016-11-15 LAB — LIPID PANEL
CHOL/HDL RATIO: 3.4 ratio (ref 0.0–4.4)
Cholesterol, Total: 141 mg/dL (ref 100–199)
HDL: 41 mg/dL (ref >39)
LDL Calculated: 86 mg/dL (ref 0–99)
Triglycerides: 70 mg/dL (ref 0–149)
VLDL Cholesterol Cal: 14 mg/dL (ref 5–40)

## 2016-11-15 LAB — HCV INTERPRETATION

## 2016-11-15 LAB — RPR: RPR: NONREACTIVE

## 2016-11-16 LAB — GC/CHLAMYDIA PROBE AMP
Chlamydia trachomatis, NAA: NEGATIVE
NEISSERIA GONORRHOEAE BY PCR: NEGATIVE

## 2016-12-27 ENCOUNTER — Encounter (HOSPITAL_BASED_OUTPATIENT_CLINIC_OR_DEPARTMENT_OTHER): Payer: Self-pay | Admitting: Emergency Medicine

## 2016-12-27 ENCOUNTER — Ambulatory Visit: Payer: BLUE CROSS/BLUE SHIELD | Admitting: Physician Assistant

## 2016-12-27 ENCOUNTER — Emergency Department (HOSPITAL_BASED_OUTPATIENT_CLINIC_OR_DEPARTMENT_OTHER)
Admission: EM | Admit: 2016-12-27 | Discharge: 2016-12-27 | Disposition: A | Discharge status: Home / Self Care | Payer: BLUE CROSS/BLUE SHIELD | Attending: Emergency Medicine | Admitting: Emergency Medicine

## 2016-12-27 DIAGNOSIS — Z3A14 14 weeks gestation of pregnancy: Secondary | ICD-10-CM | POA: Insufficient documentation

## 2016-12-27 DIAGNOSIS — O219 Vomiting of pregnancy, unspecified: Secondary | ICD-10-CM | POA: Diagnosis not present

## 2016-12-27 DIAGNOSIS — O99512 Diseases of the respiratory system complicating pregnancy, second trimester: Secondary | ICD-10-CM | POA: Insufficient documentation

## 2016-12-27 DIAGNOSIS — R0981 Nasal congestion: Secondary | ICD-10-CM

## 2016-12-27 DIAGNOSIS — J45909 Unspecified asthma, uncomplicated: Secondary | ICD-10-CM

## 2016-12-27 MED ORDER — CETIRIZINE HCL 10 MG PO TABS: 10.0000 mg | ORAL_TABLET | Freq: Every day | ORAL | 0 refills | Status: DC

## 2016-12-27 MED ORDER — ALBUTEROL SULFATE HFA 108 (90 BASE) MCG/ACT IN AERS
2.0000 | INHALATION_SPRAY | Freq: Once | RESPIRATORY_TRACT | Status: AC
  Administered 2016-12-27: 2 via RESPIRATORY_TRACT
  Filled 2016-12-27: qty 6.7

## 2016-12-27 NOTE — ED Triage Notes (Signed)
Patient states that she is [redacted] weeks pregnant. Has had SOB at night for the last 2 -3 nights. Is out of her inhaler. Denies any pain at this time but reports that she has some at night when she is "wheezing" _ no distress noted in triage

## 2016-12-27 NOTE — Discharge Instructions (Signed)
Medications: Zyrtec, albuterol inhaler  Treatment: Take Zyrtec once daily for allergy symptoms. Use albuterol inhaler every 4-6 hours as needed for wheezing or shortness of breath.  Follow-up: Please follow-up with your primary care provider as needed if your symptoms are persisting. If you develop any new or worsening symptoms, such as fever over 100.4, severe cough, chest pain, or severe shortness of breath unresolved by inhaler, please return to emergency department as soon as possible.

## 2016-12-27 NOTE — ED Provider Notes (Signed)
[facility] Provider Note   CSN: [id number] Arrival date & time: 12/27/16  1614   By signing my name below, I, Stacy Shepard, attest that this documentation has been prepared under the direction and in the presence of Buel Ream, PA-C. Electronically Signed: Clarisse Shepard, Scribe. 12/27/16. 5:23 PM.   History   Chief Complaint Chief Complaint  Patient presents with  . Cough   The history is provided by the patient and medical records. No language interpreter was used.    Stacy Shepard is a 31 y.o. female with h/o asthma, [redacted] weeks pregnant presenting to the Emergency Department with chief complaint of gradually worsening congestion x 4 days. Wheezing and chest tightness onset 2 days ago worse at night. Pt reports chest "coolness" and cough only last night. She states this is consistent with h/o asthma. She notes rare asthma exacerbations; she states URI's and environmental allergens normally aggravate chronic asthma. She states she has run out of her inhaler at home. She has not taken any allergy medications at home. Pt vomiting d/t pregnancy but none otherwise. Regular prenatal visits with OB/GYN noted. No chest pain, sore throat or abdominal pain. No other complaints at this time.  Past Medical History:  Diagnosis Date  . Asthma     Patient Active Problem List   Diagnosis Date Noted  . 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 Fatigue 05/30/2016    History reviewed. No pertinent surgical history.  OB History    Gravida Para Term Preterm AB Living   1             SAB TAB Ectopic Multiple Live Births                   Home Medications    Prior to Admission medications   Medication Sig Start Date End Date Taking? Authorizing Provider  albuterol (PROVENTIL HFA;VENTOLIN HFA) 108 (90 BASE) MCG/ACT inhaler Inhale 1-2 puffs into the lungs every 4 (four) hours as needed for wheezing or shortness of breath. 05/13/14   Shade Flood, MD  cetirizine  (ZYRTEC ALLERGY) 10 MG tablet Take 1 tablet (10 mg total) by mouth daily. 12/27/16   Emi Holes, PA-C  Prenatal Vit-Fe Fumarate-FA (PRENATAL MULTIVITAMIN) TABS tablet Take 1 tablet by mouth daily at 12 noon.    [provider]    Family History Family History  Problem Relation Age of Onset  . Cancer Mother   . Diabetes Mother   . Hypertension Mother   . Hypertension Sister   . Cancer Brother   . Stroke Maternal Grandmother   . Hypertension Maternal Grandfather     Social History Social History  Substance Use Topics  . Smoking status: Never Smoker  . Smokeless tobacco: Never Used  . Alcohol use 0.5 oz/week    1 drink(s) per week     Allergies   Patient has no known allergies.   Review of Systems Review of Systems  HENT: Positive for congestion. Negative for sore throat.   Respiratory: Positive for cough, chest tightness and wheezing.   Cardiovascular: Negative for chest pain.  Gastrointestinal: Positive for nausea and vomiting. Negative for abdominal pain.  All other systems reviewed and are negative.    Physical Exam Updated Vital Signs BP (!) 114/98 (BP Location: Left Arm)   Pulse 89   Temp 98.1 F (36.7 C) (Oral)   Resp (!) 22   Ht 5\' 8"  (1.727 m)   Wt 180 lb (81.6 kg)  LMP 05/11/2016   SpO2 100%   BMI 27.37 kg/m   Physical Exam  Constitutional: She appears well-developed and well-nourished. No distress.  HENT:  Head: Normocephalic and atraumatic.  Right Ear: Tympanic membrane and external ear normal.  Left Ear: Tympanic membrane and external ear normal.  Mouth/Throat: Oropharynx is clear and moist. No oropharyngeal exudate.  Eyes: Conjunctivae are normal. Pupils are equal, round, and reactive to light. Right eye exhibits no discharge. Left eye exhibits no discharge. No scleral icterus.  Neck: Normal range of motion. Neck supple. No thyromegaly present.  Cardiovascular: Normal rate, regular rhythm, normal heart sounds and intact distal  pulses.  Exam reveals no gallop and no friction rub.   No murmur heard. Pulmonary/Chest: No stridor.  No audible wheezing. Decreased breath sounds at bases bilaterally.  Abdominal: Soft. Bowel sounds are normal. She exhibits no distension. There is no tenderness. There is no rebound and no guarding.  Musculoskeletal: She exhibits no edema.  Lymphadenopathy:    She has no cervical adenopathy.  Neurological: She is alert. Coordination normal.  Skin: Skin is warm and dry. No rash noted. She is not diaphoretic. No pallor.  Psychiatric: She has a normal mood and affect.  Nursing note and vitals reviewed.    ED Treatments / Results  DIAGNOSTIC STUDIES: Oxygen Saturation is 100% on RA, NL by my interpretation.    COORDINATION OF CARE: 5:10 PM-Discussed next steps with pt. Pt verbalized understanding and is agreeable with the plan. Will order/ Rx medications. Pt prepared for d/c, advised of symptomatic care at home and return precautions.    Labs (all labs ordered are listed, but only abnormal results are displayed) Labs Reviewed - No data to display  EKG  EKG Interpretation None       Radiology No results found.  Procedures Procedures (including critical care time)  Medications Ordered in ED Medications  albuterol (PROVENTIL HFA;VENTOLIN HFA) 108 (90 Base) MCG/ACT inhaler 2 puff (2 puffs Inhalation Given 12/27/16 1722)     Initial Impression / Assessment and Plan / ED Course  I have reviewed the triage vital signs and the nursing notes.  Pertinent labs & imaging results that were available during my care of the patient were reviewed by me and considered in my medical decision making (see chart for details).     Patient with mild signs and symptoms of asthma/RAD. Oxygen saturation at 100%. No accessory muscle use, no cyanosis, afebrile. Treated in the ED with inhaler and patient feels improved and back to baseline after treatment. Mild wheezing right base heard after  albuterol treatments, but breath sounds improved at bases after treatment. Considering patient is back to baseline, I feel patient is safe for discharge with Albuterol inhaler and Zyrtec. Pt instructed to follow up with PCP. Discussed return precautions. Patient understands and agrees with plan. Patient vitals stable throughout ED course and discharged in satisfactory condition.  Final Clinical Impressions(s) / ED Diagnoses   Final diagnoses:  Uncomplicated asthma, unspecified asthma severity, unspecified whether persistent  Nasal congestion    New Prescriptions Discharge Medication List as of 12/27/2016  5:35 PM    START taking these medications   Details  cetirizine (ZYRTEC ALLERGY) 10 MG tablet Take 1 tablet (10 mg total) by mouth daily., Starting Sat 12/27/2016, Print      I personally performed the services described in this documentation, which was scribed in my presence. The recorded information has been reviewed and is accurate.    Emi HolesLaw, Ekaterina Denise M, PA-C 12/28/16  1610    Arby Barrette, MD 01/02/17 [phone]

## 2017-01-16 LAB — OB RESULTS CONSOLE HEPATITIS B SURFACE ANTIGEN: Hepatitis B Surface Ag: NEGATIVE

## 2017-01-16 LAB — OB RESULTS CONSOLE ABO/RH: RH Type: POSITIVE

## 2017-01-16 LAB — OB RESULTS CONSOLE GC/CHLAMYDIA
CHLAMYDIA, DNA PROBE: NEGATIVE
Gonorrhea: NEGATIVE

## 2017-01-16 LAB — OB RESULTS CONSOLE RPR: RPR: NONREACTIVE

## 2017-01-16 LAB — OB RESULTS CONSOLE HIV ANTIBODY (ROUTINE TESTING): HIV: NONREACTIVE

## 2017-01-16 LAB — OB RESULTS CONSOLE ANTIBODY SCREEN: ANTIBODY SCREEN: NEGATIVE

## 2017-01-16 LAB — OB RESULTS CONSOLE RUBELLA ANTIBODY, IGM: RUBELLA: IMMUNE

## 2017-06-10 LAB — OB RESULTS CONSOLE GBS: GBS: POSITIVE

## 2017-06-29 ENCOUNTER — Inpatient Hospital Stay (HOSPITAL_COMMUNITY)
Admission: AD | Admit: 2017-06-29 | Discharge: 2017-07-03 | DRG: 788 | Disposition: A | Discharge status: Home / Self Care | Payer: BLUE CROSS/BLUE SHIELD | Source: Ambulatory Visit | Attending: Obstetrics & Gynecology | Admitting: Obstetrics & Gynecology

## 2017-06-29 ENCOUNTER — Other Ambulatory Visit: Payer: Self-pay

## 2017-06-29 ENCOUNTER — Encounter (HOSPITAL_COMMUNITY): Payer: Self-pay | Admitting: *Deleted

## 2017-06-29 DIAGNOSIS — O4103X0 Oligohydramnios, third trimester, not applicable or unspecified: Principal | ICD-10-CM | POA: Diagnosis present

## 2017-06-29 DIAGNOSIS — O9902 Anemia complicating childbirth: Secondary | ICD-10-CM | POA: Diagnosis present

## 2017-06-29 DIAGNOSIS — Z98891 History of uterine scar from previous surgery: Secondary | ICD-10-CM

## 2017-06-29 DIAGNOSIS — O365930 Maternal care for other known or suspected poor fetal growth, third trimester, not applicable or unspecified: Secondary | ICD-10-CM | POA: Diagnosis present

## 2017-06-29 DIAGNOSIS — O9952 Diseases of the respiratory system complicating childbirth: Secondary | ICD-10-CM | POA: Diagnosis present

## 2017-06-29 DIAGNOSIS — O4100X0 Oligohydramnios, unspecified trimester, not applicable or unspecified: Secondary | ICD-10-CM | POA: Diagnosis present

## 2017-06-29 DIAGNOSIS — Z3A39 39 weeks gestation of pregnancy: Secondary | ICD-10-CM | POA: Diagnosis not present

## 2017-06-29 DIAGNOSIS — D649 Anemia, unspecified: Secondary | ICD-10-CM | POA: Diagnosis present

## 2017-06-29 DIAGNOSIS — J45909 Unspecified asthma, uncomplicated: Secondary | ICD-10-CM | POA: Diagnosis present

## 2017-06-29 DIAGNOSIS — O99824 Streptococcus B carrier state complicating childbirth: Secondary | ICD-10-CM | POA: Diagnosis present

## 2017-06-29 DIAGNOSIS — O365990 Maternal care for other known or suspected poor fetal growth, unspecified trimester, not applicable or unspecified: Secondary | ICD-10-CM | POA: Diagnosis present

## 2017-06-29 LAB — LACTATE DEHYDROGENASE: LDH: 107 U/L (ref 98–192)

## 2017-06-29 LAB — COMPREHENSIVE METABOLIC PANEL
ALT: 15 U/L (ref 14–54)
AST: 22 U/L (ref 15–41)
Albumin: 3.5 g/dL (ref 3.5–5.0)
Alkaline Phosphatase: 67 U/L (ref 38–126)
Anion gap: 9 (ref 5–15)
BUN: 8 mg/dL (ref 6–20)
CO2: 22 mmol/L (ref 22–32)
Calcium: 9 mg/dL (ref 8.9–10.3)
Chloride: 104 mmol/L (ref 101–111)
Creatinine, Ser: 0.44 mg/dL (ref 0.44–1.00)
GFR calc Af Amer: >60 mL/min (ref >60)
GFR calc non Af Amer: >60 mL/min (ref >60)
Glucose, Bld: 84 mg/dL (ref 65–99)
Potassium: 3.6 mmol/L (ref 3.5–5.1)
Sodium: 135 mmol/L (ref 135–145)
Total Bilirubin: 0.7 mg/dL (ref 0.3–1.2)
Total Protein: 7.2 g/dL (ref 6.5–8.1)

## 2017-06-29 LAB — CBC
HCT: 31.8 % — ABNORMAL LOW (ref 36.0–46.0)
Hemoglobin: 11.3 g/dL — ABNORMAL LOW (ref 12.0–15.0)
MCH: 26.5 pg (ref 26.0–34.0)
MCHC: 35.5 g/dL (ref 30.0–36.0)
MCV: 74.5 fL — ABNORMAL LOW (ref 78.0–100.0)
Platelets: 192 10*3/uL (ref 150–400)
RBC: 4.27 MIL/uL (ref 3.87–5.11)
RDW: 15.7 % — ABNORMAL HIGH (ref 11.5–15.5)
WBC: 8.6 10*3/uL (ref 4.0–10.5)

## 2017-06-29 LAB — TYPE AND SCREEN
ABO/RH(D): O POS
Antibody Screen: NEGATIVE

## 2017-06-29 LAB — URIC ACID: Uric Acid, Serum: 5.7 mg/dL (ref 2.3–6.6)

## 2017-06-29 LAB — PROTEIN / CREATININE RATIO, URINE
Creatinine, Urine: 42 mg/dL
Total Protein, Urine: <6 mg/dL

## 2017-06-29 MED ORDER — SOD CITRATE-CITRIC ACID 500-334 MG/5ML PO SOLN
30.0000 mL | ORAL | Status: DC | PRN
  Administered 2017-07-01: 30 mL via ORAL
  Filled 2017-06-29: qty 15

## 2017-06-29 MED ORDER — ONDANSETRON HCL 4 MG/2ML IJ SOLN
4.0000 mg | Freq: Four times a day (QID) | INTRAMUSCULAR | Status: DC | PRN
  Administered 2017-07-01: 4 mg via INTRAVENOUS

## 2017-06-29 MED ORDER — LACTATED RINGERS IV SOLN
500.0000 mL | INTRAVENOUS | Status: DC | PRN
  Administered 2017-06-30: 500 mL via INTRAVENOUS

## 2017-06-29 MED ORDER — FENTANYL CITRATE (PF) 100 MCG/2ML IJ SOLN: 50.0000 ug | INTRAMUSCULAR | Status: DC | PRN

## 2017-06-29 MED ORDER — PENICILLIN G POT IN DEXTROSE 60000 UNIT/ML IV SOLN
3.0000 10*6.[IU] | INTRAVENOUS | Status: DC
  Administered 2017-06-30 – 2017-07-01 (×8): 3 10*6.[IU] via INTRAVENOUS
  Filled 2017-06-29 (×10): qty 50

## 2017-06-29 MED ORDER — LACTATED RINGERS IV SOLN
INTRAVENOUS | Status: DC
  Administered 2017-06-29 – 2017-06-30 (×3): via INTRAVENOUS

## 2017-06-29 MED ORDER — OXYTOCIN 40 UNITS IN LACTATED RINGERS INFUSION - SIMPLE MED
2.5000 [IU]/h | INTRAVENOUS | Status: DC
  Filled 2017-06-29: qty 1000

## 2017-06-29 MED ORDER — MISOPROSTOL 25 MCG QUARTER TABLET
25.0000 ug | ORAL_TABLET | ORAL | Status: DC | PRN
  Administered 2017-06-29: 25 ug via VAGINAL
  Filled 2017-06-29: qty 1

## 2017-06-29 MED ORDER — OXYCODONE-ACETAMINOPHEN 5-325 MG PO TABS: 1.0000 | ORAL_TABLET | ORAL | Status: DC | PRN

## 2017-06-29 MED ORDER — TERBUTALINE SULFATE 1 MG/ML IJ SOLN: 0.2500 mg | Freq: Once | INTRAMUSCULAR | Status: DC | PRN

## 2017-06-29 MED ORDER — ACETAMINOPHEN 325 MG PO TABS: 650.0000 mg | ORAL_TABLET | ORAL | Status: DC | PRN

## 2017-06-29 MED ORDER — OXYCODONE-ACETAMINOPHEN 5-325 MG PO TABS: 2.0000 | ORAL_TABLET | ORAL | Status: DC | PRN

## 2017-06-29 MED ORDER — LIDOCAINE HCL (PF) 1 % IJ SOLN: 30.0000 mL | INTRAMUSCULAR | Status: DC | PRN

## 2017-06-29 MED ORDER — OXYTOCIN BOLUS FROM INFUSION: 500.0000 mL | Freq: Once | INTRAVENOUS | Status: DC

## 2017-06-29 MED ORDER — PENICILLIN G POTASSIUM 5000000 UNITS IJ SOLR
5.0000 10*6.[IU] | Freq: Once | INTRAVENOUS | Status: AC
  Administered 2017-06-29: 5 10*6.[IU] via INTRAVENOUS
  Filled 2017-06-29: qty 5

## 2017-06-29 NOTE — Anesthesia Pain Management Evaluation Note (Signed)
  [facility] Pain Management Visit Note  Patient: Stacy Shepard, 31 y.o., female  "Hello I am a [facility] of the anesthesia team at [facility]. We have an anesthesia team available at all times to provide care throughout the hospital, including epidural management and anesthesia for C-section. I don't know your plan for the delivery whether it a natural birth, water birth, IV sedation, nitrous supplementation, doula or epidural, but we want to meet your pain goals."   1.Was your pain managed to your expectations on prior hospitalizations?   No prior hospitalizations  2.What is your expectation for pain management during this hospitalization?     Epidural, IV pain meds and Nitrous Oxide  3.How can we help you reach that goal? Be available  Record the patient's initial score and the patient's pain goal.   Pain: 1  Pain Goal: 5 [facility] wants you to be able to say your pain was always managed very well.  [facility] Center D/P SnfMERRITT,Jayon Matton 06/29/2017

## 2017-06-29 NOTE — H&P (Signed)
Stacy Shepard is a 31 y.o.G1P0  female presenting for Induction of labor @ 39+[redacted] weeks gestation due to Oligohydramnios, IUGR <2%, elevated dopplers, elevated blood pressures and GBS positive. Explained risks and benefits of induction of labor. Explained cytotec being used as a cervical ripening agent. Pt verbalizes understanding and has consented to plan of care. OB History    Gravida Para Term Preterm AB Living   1             SAB TAB Ectopic Multiple Live Births                 Past Medical History:  Diagnosis Date  . Asthma   Hyperthyroidism History reviewed. No pertinent surgical history. Family History: family history includes Cancer in her brother and mother; Diabetes in her mother; Hypertension in her maternal grandfather, mother, and sister; Stroke in her maternal grandmother. Social History:  reports that  has never smoked. she has never used smokeless tobacco. She reports that she drinks about 0.5 oz of alcohol per week. She reports that she does not use drugs.     Maternal Diabetes: No Genetic Screening: Normal Maternal Ultrasounds/Referrals: Abnormal:  Findings:   IUGR, Other: oligohydramnios and elevated dopplers Fetal Ultrasounds or other Referrals:  None Maternal Substance Abuse:  No Significant Maternal Medications:  None Significant Maternal Lab Results:  Lab values include: Group B Strep positive Other Comments:  None EFW per U/S  On 06/17/17 is 4lb 14 oz AFI -4.5 ROS Maternal Medical History:  Fetal activity: Perceived fetal activity is normal.   Last perceived fetal movement was within the past hour.    Prenatal complications: IUGR and oligohydramnios.   Anemia GBS positive      Blood pressure (!) 154/96, pulse 91, temperature 98 F (36.7 C), temperature source Oral, resp. rate 18, height 5\' 8"  (1.727 m), weight 220 lb (99.8 kg), SpO2 99 %. Maternal Exam:  Uterine Assessment: Contraction frequency is rare.   Abdomen: Patient reports no abdominal  tenderness. Fundal height is 37.   Fetal presentation: vertex  Introitus: not evaluated.   Pelvis: adequate for delivery.   Cervix: not evaluated.   Fetal Exam Fetal Monitor Review: Mode: ultrasound.   Baseline rate: 135.  Variability: moderate (6-25 bpm).   Pattern: accelerations present and no decelerations.    Fetal State Assessment: Category I - tracings are normal.     Physical Exam  Nursing note and vitals reviewed. Constitutional: She is oriented to person, place, and time. She appears well-developed and well-nourished.  HENT:  Head: Normocephalic and atraumatic.  Neck: Normal range of motion.  Cardiovascular: Normal rate and regular rhythm.  Respiratory: Breath sounds normal. No respiratory distress.  GI: Soft. She exhibits no distension.  Musculoskeletal: Normal range of motion.  Neurological: She is alert and oriented to person, place, and time.  Skin: Skin is warm and dry.  Psychiatric: She has a normal mood and affect. Her behavior is normal. Judgment and thought content normal.   Cervix -0/50/-2 VTX  Patient Vitals for the past 24 hrs:  BP Temp Temp src Pulse Resp SpO2 Height Weight  06/29/17 2006 (!) 154/96 98 F (36.7 C) Oral 91 18 99 % 5\' 8"  (1.727 m) 220 lb (99.8 kg)   Results for orders placed or performed during the hospital encounter of 06/29/17 (from the past 24 hour(s))  CBC     Status: Abnormal   Collection Time: 06/29/17  8:15 PM  Result Value Ref Range   WBC 8.6  4.0 - 10.5 K/uL   RBC 4.27 3.87 - 5.11 MIL/uL   Hemoglobin 11.3 (L) 12.0 - 15.0 g/dL   HCT 54.031.8 (L) 98.136.0 - 19.146.0 %   MCV 74.5 (L) 78.0 - 100.0 fL   MCH 26.5 26.0 - 34.0 pg   MCHC 35.5 30.0 - 36.0 g/dL   RDW 47.815.7 (H) 29.511.5 - 62.115.5 %   Platelets 192 150 - 400 K/uL  Type and screen [facility]     Status: None   Collection Time: 06/29/17  8:15 PM  Result Value Ref Range   ABO/RH(D) O POS    Antibody Screen NEG    Sample Expiration 07/02/2017    Prenatal  labs: ABO, Rh: O/Positive/-- (06/22 0000) Antibody: Negative (06/22 0000) Rubella: Immune (06/22 0000) RPR: Nonreactive (06/22 0000)  HBsAg: Negative (06/22 0000)  HIV: Non-reactive (06/22 0000)  GBS: Positive (11/14 0000)   Assessment/Plan: Induction @ 39+2 Oligohydramnios IUGR Elevated Doppler  GBS positive Anemia Asthma Cat 1 strip  Cytotec for cervical ripening Continuous EFM GBS Prophylaxis now Planning unmedicated labor Anticipate Vaginal Delivery  Jonika Critz A Romelo Sciandra CNM 06/29/2017, 9:24 PM

## 2017-06-30 LAB — RPR: RPR Ser Ql: NONREACTIVE

## 2017-06-30 LAB — ABO/RH: ABO/RH(D): O POS

## 2017-06-30 MED ORDER — BUTORPHANOL TARTRATE 1 MG/ML IJ SOLN
2.0000 mg | Freq: Once | INTRAMUSCULAR | Status: AC
  Administered 2017-06-30: 2 mg via INTRAVENOUS
  Filled 2017-06-30: qty 2

## 2017-06-30 MED ORDER — OXYTOCIN 40 UNITS IN LACTATED RINGERS INFUSION - SIMPLE MED
1.0000 m[IU]/min | INTRAVENOUS | Status: DC
  Administered 2017-06-30 – 2017-07-01 (×2): 1 m[IU]/min via INTRAVENOUS
  Filled 2017-06-30: qty 1000

## 2017-06-30 MED ORDER — FENTANYL CITRATE (PF) 100 MCG/2ML IJ SOLN
100.0000 ug | INTRAMUSCULAR | Status: DC | PRN
  Administered 2017-07-01 (×2): 100 ug via INTRAVENOUS
  Filled 2017-06-30 (×2): qty 2

## 2017-06-30 MED ORDER — PROMETHAZINE HCL 25 MG/ML IJ SOLN
12.5000 mg | Freq: Once | INTRAMUSCULAR | Status: AC
  Administered 2017-06-30: 12.5 mg via INTRAVENOUS
  Filled 2017-06-30: qty 1

## 2017-06-30 NOTE — Progress Notes (Signed)
Subjective: Pt comfortable.  States pain a 1 on 0 to 10 scale.  Objective: BP (!) 145/83   Pulse 88   Temp 99 F (37.2 C) (Oral)   Resp 18   Ht 5\' 8"  (1.727 m)   Wt 99.8 kg (220 lb)   LMP 09/27/2016   SpO2 99%   BMI 33.45 kg/m  No intake/output data recorded. No intake/output data recorded.  FHT: Category 1 UC:   irregular,  SVE:   Dilation: Fingertip Effacement (%): 60 Station: -3 Exam by:: Dr. Rayetta Piggoberts Foley still in place Assessment:  A: Pt is a G1P0 at 39.3 IUP induction for IUGR     Oligohydramnios Cat 1 strip GBS positive    Plan: Pt to eat Monitor progress If foley doesn't fall out by 0100 will remove.  Stacy Shepard CNM, MSN 06/30/2017, 9:14 PM

## 2017-06-30 NOTE — Progress Notes (Addendum)
Stacy Shepard is a 31 y.o. G1P0 at 2669w3d undergoing induction.  Subjective: No complaints  Objective: BP (!) 144/80   Pulse 79   Temp 98.7 F (37.1 C) (Oral)   Resp 20   Ht 5\' 8"  (1.727 m)   Wt 99.8 kg (220 lb)   LMP 09/27/2016   SpO2 99%   BMI 33.45 kg/m  No intake/output data recorded. No intake/output data recorded.  FHT:  FHR: 140s bpm, variability: moderate,  accelerations:  Present,  decelerations:  Absent until pt turned on her back for placement of foley bulb at which time she had a decel which recovered with position change to left tilt and pitocin stopped.  Min-mod variability after stadol given. UC:   regular, every 1-3 minutes SVE:   Dilation: Fingertip Effacement (%): 60 Station: -3 Exam by:: Dr. Su Hiltoberts  Bedside U/S - vertex  Labs: Lab Results  Component Value Date   WBC 8.6 06/29/2017   HGB 11.3 (L) 06/29/2017   HCT 31.8 (L) 06/29/2017   MCV 74.5 (L) 06/29/2017   PLT 192 06/29/2017    Assessment / Plan: Induction due to IUGR and Oligohydramnios.  Cytotec not tolerated.  Pt placed on pitocin with occasional variables earlier and pitocin held.  Options discussed with pt and family and she is agreeable to placement of foley bulb.  s/p decel as documented above with FHT recovery, 22Fr foley bulb was placed with 30cc balloon.  Pt placed in stirrups and speculum placed.  Foley bulb placed in cervix and bulb incrementally insufflated to 30cc.  Pt very uncomfortable at 30cc.  Plan to give pt IV pain medicine and then reevaluate for filling the bulb to 60cc.  Upon return to the room pt was sleeping comfortable.  Decision made to leave bulb at 30cc for now.  Labor: cervical ripening with foley bulb Preeclampsia:  no signs or symptoms of toxicity Fetal Wellbeing:  Category I Pain Control:  IV pain meds I/D:  GBS + on PCN Anticipated MOD:  NSVD  Stacy Shepard 06/30/2017, 2:05 PM

## 2017-06-30 NOTE — Progress Notes (Signed)
Stacy Shepard is a 31 y.o. G1P0 at 4159w3d   Subjective: Sleeping comfortably.  Objective: BP (!) 143/70   Pulse 70   Temp 98.7 F (37.1 C) (Oral)   Resp 20   Ht 5\' 8"  (1.727 m)   Wt 99.8 kg (220 lb)   LMP 09/27/2016   SpO2 99%   BMI 33.45 kg/m  No intake/output data recorded. No intake/output data recorded.  FHT:  Cat 1 UC:   Not picking up well SVE:   Dilation: Fingertip Effacement (%): 60 Station: -3 Exam by:: Dr. Su Hiltoberts  Labs: Lab Results  Component Value Date   WBC 8.6 06/29/2017   HGB 11.3 (L) 06/29/2017   HCT 31.8 (L) 06/29/2017   MCV 74.5 (L) 06/29/2017   PLT 192 06/29/2017    Assessment / Plan: Induction  Labor: cervical ripening with foley bulb Preeclampsia:  no signs or symptoms of toxicity Fetal Wellbeing:  Category I Pain Control:  IV pain meds I/D:  GBS + on PCN Anticipated MOD:  NSVD  Stacy Shepard 06/30/2017, 4:34 PM

## 2017-06-30 NOTE — Progress Notes (Signed)
Patient ID: [name], female   DOB: 1986/05/18, 31 y.o.   MRN: [id number]  S: This patient has been doing well since her first cytotec was placed . She has been resting on her left side and baby has tolerated contractions and has had a reactive strip with baseline of 135. At 215 I was called to reviewed strip because RN was having difficulty determining if pt was having accels or decels. RN states that pt got up to the bathroom and when she returned to bed that the FHR baseline changed.  O: FHT's noted with moderate variability and occasional variable and occasional late; not consistent.  A: IUGR     Oligohydramnios P Consulted Dr. Mora ApplPinn for Primary C/S     Hold Cytotec  Illene BolusLori Rihanna Marseille CNM

## 2017-06-30 NOTE — Progress Notes (Signed)
Stacy ParrVerlesha N Shepard is a 31 y.o. G1P0 at 3912w3d by LMP admitted for induction of labor due to Low amniotic fluid. and Poor fetal growth.  Subjective: Patient feeling more cramping, not severe. She denies HA, visual changes  Objective: BP (!) 143/89   Pulse 84   Temp 98.6 F (37 C) (Oral)   Resp 18   Ht 5\' 8"  (1.727 m)   Wt 99.8 kg (220 lb)   LMP 09/27/2016   SpO2 99%   BMI 33.45 kg/m  No intake/output data recorded. No intake/output data recorded.  FHT:  FHR: 145 bpm, variability: moderate,  accelerations:  Present,  decelerations:  Present three episodes of late decels, longest one 1 minute nadir 80's with return to baseline UC:   irregular, every 3-5 minutes SVE:   Dilation: Fingertip Effacement (%): 60 Station: -3, -2 Exam by:: dr Mora Applpinn  Labs: Lab Results  Component Value Date   WBC 8.6 06/29/2017   HGB 11.3 (L) 06/29/2017   HCT 31.8 (L) 06/29/2017   MCV 74.5 (L) 06/29/2017   PLT 192 06/29/2017    Assessment / Plan: Induction of labor due to IUGR and oligohydramnios. Several episodes earlier of late decels.  Patient is s/p  One dose of misoprostol.  Currently the fetal heart tracing is Category I, with  Moderate variability and no decelerations.  Discussed at length with patient and her family the likelihood of delivery via cesarean  Section.  We discussed that we will start low dose pitocin and assess if the baby  Tolerates more frequent and powerful contractions.   We discussed that if there is any  Evidence of fetal intolerance of labor, my recommendation would be to proceed with a primary  Cesarean delivery.    Patient does not desire regional anesthesia via an epidural, however, we  Discussed that if a cesarean is to be performed I would recommend that she have a spinal.   Labor: Early labor Preeclampsia:  no signs or symptoms of toxicity, intake and ouput balanced, labs stable and Blood pressures elevated, mild range Fetal Wellbeing:  Category II Pain  Control:  Labor support without medications I/D:  n/a Anticipated MOD:  unknown  Stacy Shepard Stacy Shepard 06/30/2017, 3:45 AM

## 2017-07-01 ENCOUNTER — Encounter (HOSPITAL_COMMUNITY)
Admission: AD | Disposition: A | Discharge status: Home / Self Care | Payer: Self-pay | Source: Ambulatory Visit | Attending: Obstetrics & Gynecology

## 2017-07-01 ENCOUNTER — Encounter (HOSPITAL_COMMUNITY): Payer: Self-pay | Admitting: Obstetrics & Gynecology

## 2017-07-01 ENCOUNTER — Inpatient Hospital Stay (HOSPITAL_COMMUNITY): Payer: BLUE CROSS/BLUE SHIELD | Admitting: Anesthesiology

## 2017-07-01 LAB — CBC
HCT: 33.4 % — ABNORMAL LOW (ref 36.0–46.0)
HEMOGLOBIN: 12 g/dL (ref 12.0–15.0)
MCH: 26.8 pg (ref 26.0–34.0)
MCHC: 35.9 g/dL (ref 30.0–36.0)
MCV: 74.7 fL — ABNORMAL LOW (ref 78.0–100.0)
Platelets: 187 10*3/uL (ref 150–400)
RBC: 4.47 MIL/uL (ref 3.87–5.11)
RDW: 15.6 % — ABNORMAL HIGH (ref 11.5–15.5)
WBC: 11.6 10*3/uL — AB (ref 4.0–10.5)

## 2017-07-01 SURGERY — Surgical Case
Anesthesia: Spinal | Site: Abdomen | Wound class: Clean Contaminated

## 2017-07-01 MED ORDER — MEPERIDINE HCL 25 MG/ML IJ SOLN: 6.2500 mg | INTRAMUSCULAR | Status: DC | PRN

## 2017-07-01 MED ORDER — ZOLPIDEM TARTRATE 5 MG PO TABS: 5.0000 mg | ORAL_TABLET | Freq: Every evening | ORAL | Status: DC | PRN

## 2017-07-01 MED ORDER — SCOPOLAMINE 1 MG/3DAYS TD PT72
MEDICATED_PATCH | TRANSDERMAL | Status: AC
  Filled 2017-07-01: qty 1

## 2017-07-01 MED ORDER — WITCH HAZEL-GLYCERIN EX PADS: 1.0000 "application " | MEDICATED_PAD | CUTANEOUS | Status: DC | PRN

## 2017-07-01 MED ORDER — EPHEDRINE 5 MG/ML INJ: 10.0000 mg | INTRAVENOUS | Status: DC | PRN

## 2017-07-01 MED ORDER — DIPHENHYDRAMINE HCL 25 MG PO CAPS: 25.0000 mg | ORAL_CAPSULE | ORAL | Status: DC | PRN

## 2017-07-01 MED ORDER — PRENATAL MULTIVITAMIN CH
1.0000 | ORAL_TABLET | Freq: Every day | ORAL | Status: DC
  Administered 2017-07-02 – 2017-07-03 (×2): 1 via ORAL
  Filled 2017-07-01 (×4): qty 1

## 2017-07-01 MED ORDER — MORPHINE SULFATE-NACL 0.5-0.9 MG/ML-% IV SOSY
PREFILLED_SYRINGE | INTRAVENOUS | Status: DC | PRN
  Administered 2017-07-01: .2 mg via INTRATHECAL

## 2017-07-01 MED ORDER — KETOROLAC TROMETHAMINE 30 MG/ML IJ SOLN: 30.0000 mg | Freq: Four times a day (QID) | INTRAMUSCULAR | Status: AC | PRN

## 2017-07-01 MED ORDER — BUPIVACAINE HCL (PF) 0.25 % IJ SOLN
INTRAMUSCULAR | Status: AC
  Filled 2017-07-01: qty 30

## 2017-07-01 MED ORDER — FENTANYL 2.5 MCG/ML BUPIVACAINE 1/10 % EPIDURAL INFUSION (WH - ANES): 14.0000 mL/h | INTRAMUSCULAR | Status: DC | PRN

## 2017-07-01 MED ORDER — NALOXONE HCL 0.4 MG/ML IJ SOLN: 0.4000 mg | INTRAMUSCULAR | Status: DC | PRN

## 2017-07-01 MED ORDER — NALBUPHINE HCL 10 MG/ML IJ SOLN: 5.0000 mg | Freq: Once | INTRAMUSCULAR | Status: DC | PRN

## 2017-07-01 MED ORDER — HYDROMORPHONE HCL 1 MG/ML IJ SOLN: 0.2500 mg | INTRAMUSCULAR | Status: DC | PRN

## 2017-07-01 MED ORDER — BUPIVACAINE HCL (PF) 0.5 % IJ SOLN
INTRAMUSCULAR | Status: AC
  Filled 2017-07-01: qty 30

## 2017-07-01 MED ORDER — NALOXONE HCL 0.4 MG/ML IJ SOLN: 1.0000 ug/kg/h | INTRAMUSCULAR | Status: DC | PRN

## 2017-07-01 MED ORDER — CEFAZOLIN SODIUM-DEXTROSE 2-4 GM/100ML-% IV SOLN: 2.0000 g | INTRAVENOUS | Status: DC

## 2017-07-01 MED ORDER — OXYCODONE-ACETAMINOPHEN 5-325 MG PO TABS
1.0000 | ORAL_TABLET | ORAL | Status: DC | PRN
  Administered 2017-07-03: 1 via ORAL
  Filled 2017-07-01: qty 1

## 2017-07-01 MED ORDER — MENTHOL 3 MG MT LOZG
1.0000 | LOZENGE | OROMUCOSAL | Status: DC | PRN
  Filled 2017-07-01: qty 9

## 2017-07-01 MED ORDER — DEXAMETHASONE SODIUM PHOSPHATE 10 MG/ML IJ SOLN
INTRAMUSCULAR | Status: DC | PRN
  Administered 2017-07-01: 10 mg via INTRAVENOUS

## 2017-07-01 MED ORDER — IBUPROFEN 600 MG PO TABS
600.0000 mg | ORAL_TABLET | Freq: Four times a day (QID) | ORAL | Status: DC
  Administered 2017-07-01 – 2017-07-03 (×8): 600 mg via ORAL
  Filled 2017-07-01 (×8): qty 1

## 2017-07-01 MED ORDER — DIBUCAINE 1 % RE OINT: 1.0000 "application " | TOPICAL_OINTMENT | RECTAL | Status: DC | PRN

## 2017-07-01 MED ORDER — DIPHENHYDRAMINE HCL 50 MG/ML IJ SOLN: 12.5000 mg | INTRAMUSCULAR | Status: DC | PRN

## 2017-07-01 MED ORDER — SODIUM CHLORIDE 0.9 % IR SOLN
Status: DC | PRN
  Administered 2017-07-01: 1000 mL

## 2017-07-01 MED ORDER — PROMETHAZINE HCL 25 MG/ML IJ SOLN: 6.2500 mg | INTRAMUSCULAR | Status: DC | PRN

## 2017-07-01 MED ORDER — COCONUT OIL OIL
1.0000 "application " | TOPICAL_OIL | Status: DC | PRN
  Filled 2017-07-01: qty 120

## 2017-07-01 MED ORDER — KETOROLAC TROMETHAMINE 30 MG/ML IJ SOLN
30.0000 mg | Freq: Once | INTRAMUSCULAR | Status: DC | PRN
  Administered 2017-07-01: 30 mg via INTRAVENOUS

## 2017-07-01 MED ORDER — SIMETHICONE 80 MG PO CHEW
80.0000 mg | CHEWABLE_TABLET | Freq: Three times a day (TID) | ORAL | Status: DC
  Administered 2017-07-01 – 2017-07-03 (×5): 80 mg via ORAL
  Filled 2017-07-01 (×10): qty 1

## 2017-07-01 MED ORDER — ONDANSETRON HCL 4 MG/2ML IJ SOLN
INTRAMUSCULAR | Status: AC
  Filled 2017-07-01: qty 2

## 2017-07-01 MED ORDER — KETOROLAC TROMETHAMINE 30 MG/ML IJ SOLN
INTRAMUSCULAR | Status: AC
  Administered 2017-07-01: 30 mg via INTRAMUSCULAR
  Filled 2017-07-01: qty 1

## 2017-07-01 MED ORDER — FENTANYL CITRATE (PF) 100 MCG/2ML IJ SOLN
INTRAMUSCULAR | Status: DC | PRN
Start: 2017-07-01 — End: 2017-07-01
  Administered 2017-07-01: 20 ug via INTRATHECAL

## 2017-07-01 MED ORDER — FENTANYL CITRATE (PF) 100 MCG/2ML IJ SOLN
INTRAMUSCULAR | Status: AC
  Filled 2017-07-01: qty 2

## 2017-07-01 MED ORDER — PHENYLEPHRINE 40 MCG/ML (10ML) SYRINGE FOR IV PUSH (FOR BLOOD PRESSURE SUPPORT): 80.0000 ug | PREFILLED_SYRINGE | INTRAVENOUS | Status: DC | PRN

## 2017-07-01 MED ORDER — CEFAZOLIN SODIUM-DEXTROSE 2-3 GM-%(50ML) IV SOLR
INTRAVENOUS | Status: AC
  Filled 2017-07-01: qty 50

## 2017-07-01 MED ORDER — DEXAMETHASONE SODIUM PHOSPHATE 10 MG/ML IJ SOLN
INTRAMUSCULAR | Status: AC
  Filled 2017-07-01: qty 1

## 2017-07-01 MED ORDER — LACTATED RINGERS IV SOLN
INTRAVENOUS | Status: DC
  Administered 2017-07-01 (×2): via INTRAVENOUS

## 2017-07-01 MED ORDER — BUPIVACAINE HCL (PF) 0.25 % IJ SOLN
INTRAMUSCULAR | Status: DC | PRN
  Administered 2017-07-01: 30 mL

## 2017-07-01 MED ORDER — MORPHINE SULFATE (PF) 0.5 MG/ML IJ SOLN
INTRAMUSCULAR | Status: AC
  Filled 2017-07-01: qty 10

## 2017-07-01 MED ORDER — BUPIVACAINE IN DEXTROSE 0.75-8.25 % IT SOLN
INTRATHECAL | Status: DC | PRN
  Administered 2017-07-01: 12 mg via INTRATHECAL

## 2017-07-01 MED ORDER — SODIUM CHLORIDE 0.9% FLUSH: 3.0000 mL | INTRAVENOUS | Status: DC | PRN

## 2017-07-01 MED ORDER — OXYCODONE-ACETAMINOPHEN 5-325 MG PO TABS: 2.0000 | ORAL_TABLET | ORAL | Status: DC | PRN

## 2017-07-01 MED ORDER — PHENYLEPHRINE 8 MG IN D5W 100 ML (0.08MG/ML) PREMIX OPTIME
INJECTION | INTRAVENOUS | Status: DC | PRN
  Administered 2017-07-01: 60 ug/min via INTRAVENOUS

## 2017-07-01 MED ORDER — SIMETHICONE 80 MG PO CHEW
80.0000 mg | CHEWABLE_TABLET | ORAL | Status: DC
  Administered 2017-07-02 (×2): 80 mg via ORAL
  Filled 2017-07-01 (×4): qty 1

## 2017-07-01 MED ORDER — ONDANSETRON HCL 4 MG/2ML IJ SOLN: 4.0000 mg | Freq: Three times a day (TID) | INTRAMUSCULAR | Status: DC | PRN

## 2017-07-01 MED ORDER — PHENYLEPHRINE 8 MG IN D5W 100 ML (0.08MG/ML) PREMIX OPTIME
INJECTION | INTRAVENOUS | Status: AC
  Filled 2017-07-01: qty 100

## 2017-07-01 MED ORDER — TETANUS-DIPHTH-ACELL PERTUSSIS 5-2.5-18.5 LF-MCG/0.5 IM SUSP
0.5000 mL | Freq: Once | INTRAMUSCULAR | Status: DC
  Filled 2017-07-01: qty 0.5

## 2017-07-01 MED ORDER — LACTATED RINGERS IV SOLN
INTRAVENOUS | Status: DC | PRN
  Administered 2017-07-01: 10:00:00 via INTRAVENOUS

## 2017-07-01 MED ORDER — OXYTOCIN 40 UNITS IN LACTATED RINGERS INFUSION - SIMPLE MED: 2.5000 [IU]/h | INTRAVENOUS | Status: AC

## 2017-07-01 MED ORDER — ACETAMINOPHEN 325 MG PO TABS
650.0000 mg | ORAL_TABLET | ORAL | Status: DC | PRN
  Administered 2017-07-02: 650 mg via ORAL
  Filled 2017-07-01 (×2): qty 2

## 2017-07-01 MED ORDER — SCOPOLAMINE 1 MG/3DAYS TD PT72
1.0000 | MEDICATED_PATCH | Freq: Once | TRANSDERMAL | Status: DC
  Filled 2017-07-01: qty 1

## 2017-07-01 MED ORDER — NALBUPHINE HCL 10 MG/ML IJ SOLN: 5.0000 mg | INTRAMUSCULAR | Status: DC | PRN

## 2017-07-01 MED ORDER — LACTATED RINGERS IV SOLN: 500.0000 mL | Freq: Once | INTRAVENOUS | Status: DC

## 2017-07-01 MED ORDER — LACTATED RINGERS IV SOLN
INTRAVENOUS | Status: DC | PRN
  Administered 2017-07-01 (×2): via INTRAVENOUS

## 2017-07-01 MED ORDER — OXYTOCIN 10 UNIT/ML IJ SOLN
INTRAVENOUS | Status: DC | PRN
  Administered 2017-07-01: 40 [IU] via INTRAVENOUS

## 2017-07-01 MED ORDER — ACETAMINOPHEN 500 MG PO TABS
1000.0000 mg | ORAL_TABLET | Freq: Four times a day (QID) | ORAL | Status: AC
  Administered 2017-07-01 – 2017-07-02 (×3): 1000 mg via ORAL
  Filled 2017-07-01 (×3): qty 2

## 2017-07-01 MED ORDER — SOD CITRATE-CITRIC ACID 500-334 MG/5ML PO SOLN
30.0000 mL | ORAL | Status: DC
Start: 2017-07-01 — End: 2017-07-03

## 2017-07-01 MED ORDER — OXYTOCIN 10 UNIT/ML IJ SOLN
INTRAMUSCULAR | Status: AC
  Filled 2017-07-01: qty 4

## 2017-07-01 MED ORDER — SIMETHICONE 80 MG PO CHEW
80.0000 mg | CHEWABLE_TABLET | ORAL | Status: DC | PRN
  Filled 2017-07-01: qty 1

## 2017-07-01 MED ORDER — SENNOSIDES-DOCUSATE SODIUM 8.6-50 MG PO TABS
2.0000 | ORAL_TABLET | ORAL | Status: DC
  Administered 2017-07-02 (×2): 2 via ORAL
  Filled 2017-07-01 (×4): qty 2

## 2017-07-01 MED ORDER — DIPHENHYDRAMINE HCL 25 MG PO CAPS
25.0000 mg | ORAL_CAPSULE | Freq: Four times a day (QID) | ORAL | Status: DC | PRN
  Filled 2017-07-01: qty 1

## 2017-07-01 SURGICAL SUPPLY — 36 items
BENZOIN TINCTURE PRP APPL 2/3 (GAUZE/BANDAGES/DRESSINGS) ×3 IMPLANT
CHLORAPREP W/TINT 26ML (MISCELLANEOUS) ×3 IMPLANT
CLAMP CORD UMBIL (MISCELLANEOUS) ×3 IMPLANT
CLOSURE WOUND 1/2 X4 (GAUZE/BANDAGES/DRESSINGS) ×1
CLOTH BEACON ORANGE TIMEOUT ST (SAFETY) ×3 IMPLANT
DRSG OPSITE POSTOP 4X10 (GAUZE/BANDAGES/DRESSINGS) ×3 IMPLANT
ELECT REM PT RETURN 9FT ADLT (ELECTROSURGICAL) ×3
ELECTRODE REM PT RTRN 9FT ADLT (ELECTROSURGICAL) ×1 IMPLANT
EXTRACTOR VACUUM BELL STYLE (SUCTIONS) ×3 IMPLANT
EXTRACTOR VACUUM KIWI (MISCELLANEOUS) IMPLANT
GLOVE BIO SURGEON STRL SZ 6.5 (GLOVE) ×2 IMPLANT
GLOVE BIO SURGEONS STRL SZ 6.5 (GLOVE) ×1
GLOVE BIOGEL PI IND STRL 7.0 (GLOVE) ×2 IMPLANT
GLOVE BIOGEL PI INDICATOR 7.0 (GLOVE) ×4
GOWN STRL REUS W/TWL LRG LVL3 (GOWN DISPOSABLE) ×9 IMPLANT
KIT ABG SYR 3ML LUER SLIP (SYRINGE) ×3 IMPLANT
NEEDLE HYPO 22GX1.5 SAFETY (NEEDLE) ×3 IMPLANT
NEEDLE HYPO 25X5/8 SAFETYGLIDE (NEEDLE) ×3 IMPLANT
NS IRRIG 1000ML POUR BTL (IV SOLUTION) ×3 IMPLANT
PACK C SECTION WH (CUSTOM PROCEDURE TRAY) ×3 IMPLANT
PAD OB MATERNITY 4.3X12.25 (PERSONAL CARE ITEMS) ×3 IMPLANT
PENCIL SMOKE EVAC W/HOLSTER (ELECTROSURGICAL) ×3 IMPLANT
RTRCTR C-SECT PINK 25CM LRG (MISCELLANEOUS) ×3 IMPLANT
STRIP CLOSURE SKIN 1/2X4 (GAUZE/BANDAGES/DRESSINGS) ×2 IMPLANT
SUT CHROMIC 2 0 CT 1 (SUTURE) ×6 IMPLANT
SUT MNCRL 0 VIOLET CTX 36 (SUTURE) ×2 IMPLANT
SUT MONOCRYL 0 CTX 36 (SUTURE) ×4
SUT PDS AB 0 CTX 36 PDP370T (SUTURE) IMPLANT
SUT PLAIN 2 0 (SUTURE)
SUT PLAIN ABS 2-0 CT1 27XMFL (SUTURE) IMPLANT
SUT VIC AB 0 CTX 36 (SUTURE) ×4
SUT VIC AB 0 CTX36XBRD ANBCTRL (SUTURE) ×2 IMPLANT
SUT VIC AB 4-0 KS 27 (SUTURE) ×3 IMPLANT
SYR CONTROL 10ML LL (SYRINGE) ×3 IMPLANT
TOWEL OR 17X24 6PK STRL BLUE (TOWEL DISPOSABLE) ×3 IMPLANT
TRAY FOLEY BAG SILVER LF 14FR (SET/KITS/TRAYS/PACK) ×3 IMPLANT

## 2017-07-01 NOTE — Brief Op Note (Signed)
07/01/2017  10:56 AM  PATIENT:  Stacy ParrVerlesha N Shepard  31 y.o. female  PRE-OPERATIVE DIAGNOSIS:  category 3 tracing, non-reassuring fetal heart tracing, intrauterine growth restriction, oligohydramnios  POST-OPERATIVE DIAGNOSIS:  category 3 tracing, non-reassuring fetal heart tracing, intrauterine growth restriction, oligohydramnios  PROCEDURE:  Procedure(s): CESAREAN SECTION (N/A)  SURGEON:  Surgeon(s) and Role:    Essie Hart* Jerron Niblack, MD - Primary   ASSISTANTS: none   ANESTHESIA:   spinal  EBL:  332 mL   BLOOD ADMINISTERED:none  DRAINS: Urinary Catheter (Foley)   LOCAL MEDICATIONS USED:  MARCAINE    and Amount: 30 ml  SPECIMEN:  Source of Specimen:  Placenta  DISPOSITION OF SPECIMEN:  PATHOLOGY  COUNTS:  YES  TOURNIQUET:  * No tourniquets in log *  DICTATION: .Note written in EPIC  PLAN OF CARE: Admit to inpatient   PATIENT DISPOSITION:  PACU - hemodynamically stable.   Delay start of Pharmacological VTE agent (>24hrs) due to surgical blood loss or risk of bleeding: not applicable  Nikoli Nasser STACIA

## 2017-07-01 NOTE — Anesthesia Preprocedure Evaluation (Signed)
Anesthesia Evaluation  Patient identified by MRN, date of birth, ID band Patient awake    Reviewed: Allergy & Precautions, H&P , NPO status , Patient's Chart, lab work & pertinent test results  Airway Mallampati: II  TM Distance: >3 FB Neck ROM: full    Dental no notable dental hx. (+) Teeth Intact   Pulmonary    Pulmonary exam normal breath sounds clear to auscultation       Cardiovascular negative cardio ROS   Rhythm:regular Rate:Normal     Neuro/Psych negative neurological ROS  negative psych ROS   GI/Hepatic negative GI ROS, Neg liver ROS,   Endo/Other    Renal/GU negative Renal ROS     Musculoskeletal   Abdominal (+) + obese,   Peds  Hematology negative hematology ROS (+)   Anesthesia Other Findings   Reproductive/Obstetrics (+) Pregnancy                             Anesthesia Physical Anesthesia Plan  ASA: II  Anesthesia Plan: Spinal   Post-op Pain Management:    Induction:   PONV Risk Score and Plan: 3 and Scopolamine patch - Pre-op, Dexamethasone and Ondansetron  Airway Management Planned: Natural Airway and Nasal Cannula  Additional Equipment:   Intra-op Plan:   Post-operative Plan:   Informed Consent: I have reviewed the patients History and Physical, chart, labs and discussed the procedure including the risks, benefits and alternatives for the proposed anesthesia with the patient or authorized representative who has indicated his/her understanding and acceptance.     Plan Discussed with: CRNA and Surgeon  Anesthesia Plan Comments:         Anesthesia Quick Evaluation

## 2017-07-01 NOTE — Anesthesia Postprocedure Evaluation (Signed)
Anesthesia Post Note  Patient: Samella ParrVerlesha N Dentinger  Procedure(s) Performed: CESAREAN SECTION (N/A Abdomen)     Patient location during evaluation: PACU Anesthesia Type: Spinal Level of consciousness: awake Pain management: pain level controlled Vital Signs Assessment: post-procedure vital signs reviewed and stable Cardiovascular status: stable Postop Assessment: no headache, no backache, spinal receding, no apparent nausea or vomiting and patient able to bend at knees Anesthetic complications: no    Last Vitals:  Vitals:   07/01/17 1345 07/01/17 1445  BP: (!) 142/82 136/82  Pulse: 63 62  Resp: 18 16  Temp: 36.5 C 37 C  SpO2: 98% 98%    Last Pain:  Vitals:   07/01/17 1445  TempSrc: Oral  PainSc:    Pain Goal:                 Lorrayne Ismael JR,JOHN Nillie Bartolotta

## 2017-07-01 NOTE — Lactation Note (Signed)
This note was copied from a baby's chart. Lactation Consultation Note  Charge RN at rounds so LC is unable to obtain formula at this time.  Lab at bedside rechecking CBG.  Patient Name: Stacy Shepard ZOXWR'UToday's Date: 07/01/2017 Reason for consult: Initial assessment   Maternal Data Has patient been taught Hand Expression?: Yes Does the patient have breastfeeding experience prior to this delivery?: No  Feeding Feeding Type: Breast Fed Length of feed: 18 min  LATCH Score Latch: Repeated attempts needed to sustain latch, nipple held in mouth throughout feeding, stimulation needed to elicit sucking reflex.  Audible Swallowing: A few with stimulation  Type of Nipple: Everted at rest and after stimulation  Comfort (Breast/Nipple): Soft / non-tender  Hold (Positioning): Assistance needed to correctly position infant at breast and maintain latch.  LATCH Score: 7  Interventions Interventions: Breast feeding basics reviewed;Adjust position;DEBP;Assisted with latch;Support pillows;Skin to skin;Breast massage;Hand express;Breast compression  Lactation Tools Discussed/Used WIC Program: Yes Pump Review: Setup, frequency, and cleaning;Milk Storage Initiated by:: Threasa AlphaKelly Jesusa Shepard Date initiated:: 07/01/17   Consult Status Consult Status: Follow-up Date: 07/02/17 Follow-up type: In-patient    Stacy HancockKelly [facility] Shepard 07/01/2017, 9:34 PM

## 2017-07-01 NOTE — Progress Notes (Signed)
Subjective: Still comfortable.  Foley out Objective: BP 132/77   Pulse 88   Temp 98.8 F (37.1 C) (Oral)   Resp 18   Ht 5\' 8"  (1.727 m)   Wt 99.8 kg (220 lb)   LMP 09/27/2016   SpO2 99%   BMI 33.45 kg/m  No intake/output data recorded. No intake/output data recorded.  FHT: Category 1 UC:   irregular,  SVE:   Dilation: 4 Effacement (%): 50 Station: -2 Exam by:: Natarsha Hurwitz,cnm   Assessment:  A: Pt is a G1P0 at 39.3 IUP induction for IUGR Oligohydramnios Cat 1 strip GBS positive    Plan: Start pitocin to obtain adequate contractions If unable to trace contractions will AROM and use internals Stacy HousemanNancy Jean Mortimer Shepard CNM, MSN 07/01/2017, 1:40 AM

## 2017-07-01 NOTE — Op Note (Signed)
Cesarean Section Procedure Note  Samella ParrVerlesha N Boettner  DOB:    [birth year]  MRN:    [id number]  Date of Surgery:  07/01/2017  Indication: 31 year old G1P0 at 39 weeks 4 days admitted for induction of labor for IUGR and oligohydramnios now with recurrent late decelerations and minimal variability.   Pre-operative Diagnosis: 1. IUGR 2. NRFHT / Category III tracing  Post-operative Diagnosis: same  Procedure:  Primary cesarean delivery                         Surgeon: Wynonia HazardPINN, Golden Gilreath STACIA, MD  Assistants: None  Anesthesia: Spinal anesthesia  ASA Class: 2  Procedure Details   The patient was counseled about the risks, benefits, complications of the cesarean section. The patient concurred with the proposed plan, giving informed consent.   The patient was taken to Operating Room # 1, identified as Dani GobbleVerlesha Kilman and the procedure verified as C-Section Delivery. A Time Out was held and the above information confirmed.  After spinal was found to adequate , the patient was placed in the dorsal supine position with a leftward tilt, draped and prepped in the usual sterile manner. A Pfannenstiel incision was made with a 10 blade scalpel and the incision carried down through the subcutaneous tissue to the fascia.  The fascia was incised in the midline and the fascial incision was extended laterally with Mayo scissors. The superior aspect of the fascial incision was grasped with Coker clamps x2, tented up and the rectus muscles dissected off sharply with the bovie.  The rectus was then dissected off with blunt dissection and the bovie inferiorly. The rectus muscles were separated in the midline. The abdominal peritoneum was entered bluntly using surgeons fingers. The peritoneal opening was bluntly extended with gentle pulling.   The Alexis retractor was then deployed. The vesicouterine peritoneum was identified, tented up, entered sharply with Metzenbaum scissors, and the bladder flap was created  digitally. Scalpel was then used to make a low transverse incision on the uterus which was extended laterally with  blunt dissection. The fetal vertex was identified and delivered from cephalic occiput posterior presentation, the shoulders and body were easily delivered. There was also a compound hand.  A Healthy baby girl was born at 10:14 am Apgars 9/9.  The baby was bulb suctioned on the field, delayed cord clamping was done. Once cut the baby was handed off the the waiting Pediatricians. Umbilical cord gas was obtained, followed by cord blood.  The placenta was spontaneously removed intact. The placenta was handed off to be sent as a specimen for Pathology.  The uterus was cleared of all clot and debris. The uterine incision was repaired with #0 Monocryl in running locked fashion. A second imbricating suture was performed using the same suture. The incision was hemostatic. Ovaries and tubes were inspected and normal. The Alexis retractor was removed. The abdominal cavity was cleared of all clot and debris. The abdominal peritoneum was reapproximated with 2-0 chromic  in a running fashion, the rectus muscles was reapproximated with #2 chromic in interrupted fashion. The fascia was closed with 0 Vicryl in a running fashion. The subcuticular layer was irrigated and all bleeders cauterized.  30 mL of 0.25% Marcaine was injected into the subcutaneous layer.    The skin was closed with 4-0 vicryl in a subcuticular fashion using a Mellody DanceKeith needle. The incision was dressed with benzoine, steri strips and pressure dressing. All sponge lap and needle counts were  correct x3.   Patient tolerated the procedure well and recovered in stable condition following the procedure.  Instrument, sponge, and needle counts were correct prior the abdominal closure and at the conclusion of the case.   Findings: Live female  infant, Apgars 9/9, clear amniotic fluid, placenta 3 vessels, normal uterus, bilateral tubes and  ovaries  Estimated Blood Loss: 332 mL  IVF:  2500 mL LR         Drains: Foley catheter  Urine output: 300 mL clear         Specimens: Placenta to Pathology         Implants: none         Complications:  None; patient tolerated the procedure well.         Disposition: PACU - hemodynamically stable.   Tali Cleaves STACIA

## 2017-07-01 NOTE — Progress Notes (Signed)
Stacy HeckJessica Shepard notified that 70% of honeycomb saturated.  Pressure dressing applied over honeycomb as instructed.

## 2017-07-01 NOTE — Progress Notes (Signed)
Stacy ParrVerlesha N Shepard is a 31 y.o. G1P0 at 1420w4d by LMP admitted for induction of labor due to IUGR / Olig.  Subjective: Patient in pain,   Objective: BP (!) 150/91   Pulse 96   Temp 98.2 F (36.8 C) (Oral)   Resp 18   Ht 5\' 8"  (1.727 m)   Wt 99.8 kg (220 lb)   LMP 09/27/2016   SpO2 99%   BMI 33.45 kg/m  No intake/output data recorded. No intake/output data recorded.  FHT:  FHR: 150 bpm, variability: minimal ,  accelerations:  Abscent,  decelerations:  Present recurrent late decelerations UC:   irregular, every 6-8 minutes SVE:   Dilation: 4 Effacement (%): 50 Station: -1 Exam by:: nancy prothero,cnm  Labs: Lab Results  Component Value Date   WBC 11.6 (H) 07/01/2017   HGB 12.0 07/01/2017   HCT 33.4 (L) 07/01/2017   MCV 74.7 (L) 07/01/2017   PLT 187 07/01/2017    Assessment / Plan: Patient was being induced for IUGR / oligohydramnios at term Now with Category III tracing Discussed recommendation to proceed with primary C-section On call to OR for c-section Discussed R/B/A I for C-section with patient she voiced understanding Consent signed and placed into chart    Stacy Shepard, Stacy Shepard 07/01/2017, 9:33 AM

## 2017-07-01 NOTE — Lactation Note (Signed)
This note was copied from a baby's chart. Lactation Consultation Note P1 mom with SGA 4lb 6.7 ounce infant.  Formula given via bottle earlier due to low glucose even after glucose gel.  Mom desires to breastfeed and provide bm for her infant.  LC set up DEBP for mom and encouraged mom to pump with a goal of 8 times a day after BF in order to stimulate supply and supplement infant after feeds.  Mom understands that until supplementation amount is sufficient, formula, 22 calorie will be given after feeds.  LC provided supplementation guideline to mom.  LC reviews hand expression and containers are provided to collect colostrum.  Multiple drops are seen upon mom and LC hand expressing together.  LC taught mom to do this prior to and after feeds as well as when pumping.  Infant taken from dad's chest and placed STS with mom and 4 drops of colostrum were finger fed to infant.  Pillows positioned and blanket rolled up under Mom's breast and baby placed in football hold.  Infant latched with lips flanged and gape wide.  With compression, a few swallows were heard.  After 18 minutes, infant ceased sucking and mom burped infant.  Infant remained STS.  Dad will hold infant STS while mom eats then mom is to call out when starting to pump to ensure flange size is accurate and comfortable.Plan is for infant to bf on demand but to not allow infant to go more than 3 hours without feeding.  BF basics reviewed and lactation brochure and resource sheet given to mom.  Mom will call out when pumping.   Patient Name: Stacy Shepard UJWJX'BToday's Date: 07/01/2017 Reason for consult: Initial assessment   Maternal Data Has patient been taught Hand Expression?: Yes Does the patient have breastfeeding experience prior to this delivery?: No  Feeding Feeding Type: Breast Fed Length of feed: 18 min  LATCH Score Latch: Repeated attempts needed to sustain latch, nipple held in mouth throughout feeding, stimulation needed to  elicit sucking reflex.  Audible Swallowing: A few with stimulation  Type of Nipple: Everted at rest and after stimulation  Comfort (Breast/Nipple): Soft / non-tender  Hold (Positioning): Assistance needed to correctly position infant at breast and maintain latch.  LATCH Score: 7  Interventions Interventions: Breast feeding basics reviewed;Adjust position;DEBP;Assisted with latch;Support pillows;Skin to skin;Breast massage;Hand express;Breast compression  Lactation Tools Discussed/Used WIC Program: Yes Pump Review: Setup, frequency, and cleaning;Milk Storage Initiated by:: Threasa AlphaKelly Fawne Hughley Date initiated:: 07/01/17   Consult Status Consult Status: Follow-up Date: 07/02/17 Follow-up type: In-patient    Maryruth HancockKelly Suzanne [facility] 07/01/2017, 9:21 PM

## 2017-07-01 NOTE — Progress Notes (Signed)
Subjective: Breathing with contractions, using iv pain medication, RN unable to monitor contractions  Objective: BP (!) 155/83   Pulse 84   Temp 97.8 F (36.6 C) (Oral)   Resp 18   Ht 5\' 8"  (1.727 m)   Wt 99.8 kEncompass Health RehabilitatioKentuckyn 56Alva GaSparrKentuckyow50Alva Ga46rn1JuanitaTexas Kermit Balospitalas6Child psychoEmory Clinic Inc Dba [facility] A scalp placed x 2 secure to fetal head, unable to pick up on electronic monitor, external reapplied.  Assessment:  A:Pt is a G1P0 at 39.3 IUP induction forIUGR Oligohydramnios Cat 1 strip GBS positive   Plan: Titre pitocin for adequate contractions Pain medication or epidural as requested Anticipate SVD  Nancy Jean Prothero CNM, MSN 07/01/2017, 5:56 AM

## 2017-07-01 NOTE — Transfer of Care (Signed)
Immediate Anesthesia Transfer of Care Note  Patient: Stacy Shepard  Procedure(s) Performed: CESAREAN SECTION (N/A Abdomen)  Patient Location: PACU  Anesthesia Type:Spinal  Level of Consciousness: awake  Airway & Oxygen Therapy: Patient Spontanous Breathing  Post-op Assessment: Report given to RN  Post vital signs: Reviewed and stable  Last Vitals:  Vitals:   07/01/17 0702 07/01/17 0915  BP: (!) 150/91   Pulse: 96   Resp:    Temp:  36.8 C  SpO2:      Last Pain:  Vitals:   07/01/17 0915  TempSrc: Oral  PainSc:          Complications: No apparent anesthesia complications

## 2017-07-01 NOTE — Anesthesia Procedure Notes (Signed)
Spinal  Start time: 07/01/2017 9:45 AM End time: 07/01/2017 9:48 AM Staffing Anesthesiologist: Leilani AbleHatchett, Willem Klingensmith, MD Performed: anesthesiologist  Preanesthetic Checklist Completed: patient identified, site marked, surgical consent, pre-op evaluation, timeout performed, IV checked, risks and benefits discussed and monitors and equipment checked Spinal Block Patient position: sitting Prep: DuraPrep Patient monitoring: continuous pulse ox and blood pressure Approach: midline Location: L3-4 Injection technique: single-shot Needle Needle type: Spinocan  Needle gauge: 24 G Needle length: 10 cm Needle insertion depth: 6 cm Assessment Sensory level: T4

## 2017-07-01 NOTE — Lactation Note (Signed)
This note was copied from a baby's chart. Lactation Consultation Note Mom pumped and was unable to obtain any colostrum.  Mom states infant nursed for 15 on right side in football position from 9:45-10.  Mom states she heard some swallows again like in the previous nursing session.   LC discussed paced feeding with mom and gave infant 10 ml of neosure 22 cal.  Infant took bottle well and was burped after feed then placed STS with mom.  Mom instructed to bf with cues but not to allow over a three hour gap between feeds.  Mom knows to bf first, then pump.  Any amount of milk will be fed back to infant and additional supplemental formula will be given.  [facility] mom about the purpose of the pump to stimulate the body and protect her milk supply and not to be discouraged if she is not seeing colostrum with first pumping.  Mom is encouraged to call out with any assistance and or questions about baby and feeds.  All questions at this time have been answered.   Patient Name: Stacy Dani GobbleVerlesha Sheeley WUJWJ'XToday's Date: 07/01/2017 Reason for consult: Infant < 6lbs   Maternal Data Has patient been taught Hand Expression?: Yes Does the patient have breastfeeding experience prior to this delivery?: No  Feeding Feeding Type: Breast Fed Nipple Type: Slow - flow Length of feed: 18 min  LATCH Score Latch: Repeated attempts needed to sustain latch, nipple held in mouth throughout feeding, stimulation needed to elicit sucking reflex.  Audible Swallowing: A few with stimulation  Type of Nipple: Everted at rest and after stimulation  Comfort (Breast/Nipple): Soft / non-tender  Hold (Positioning): Assistance needed to correctly position infant at breast and maintain latch.  LATCH Score: 7  Interventions Interventions: Breast feeding basics reviewed;Adjust position;DEBP;Assisted with latch;Support pillows;Skin to skin;Breast massage;Hand express;Breast compression  Lactation Tools Discussed/Used WIC  Program: Yes Pump Review: Setup, frequency, and cleaning;Milk Storage Initiated by:: Stacy AlphaKelly Carter Shepard Date initiated:: 07/01/17   Consult Status Consult Status: Follow-up Date: 07/02/17 Follow-up type: In-patient    Stacy HancockKelly Suzanne [facility] 07/01/2017, 10:25 PM

## 2017-07-01 NOTE — Anesthesia Postprocedure Evaluation (Signed)
Anesthesia Post Note  Patient: Stacy Shepard  Procedure(s) Performed: CESAREAN SECTION (N/A Abdomen)     Patient location during evaluation: Mother Baby Anesthesia Type: Spinal Level of consciousness: awake and alert Pain management: pain level controlled Vital Signs Assessment: post-procedure vital signs reviewed and stable Respiratory status: spontaneous breathing Cardiovascular status: blood pressure returned to baseline Postop Assessment: no headache, patient able to bend at knees, no backache, no apparent nausea or vomiting, adequate PO intake and spinal receding Anesthetic complications: no    Last Vitals:  Vitals:   07/01/17 1345 07/01/17 1445  BP: (!) 142/82 136/82  Pulse: 63 62  Resp: 18 16  Temp: 36.5 C 37 C  SpO2: 98% 98%    Last Pain:  Vitals:   07/01/17 1445  TempSrc: Oral  PainSc:    Pain Goal:                 [name], Teresia Myint Marie

## 2017-07-02 LAB — CBC
HEMATOCRIT: 29.1 % — AB (ref 36.0–46.0)
Hemoglobin: 10.5 g/dL — ABNORMAL LOW (ref 12.0–15.0)
MCH: 26.8 pg (ref 26.0–34.0)
MCHC: 36.1 g/dL — AB (ref 30.0–36.0)
MCV: 74.2 fL — AB (ref 78.0–100.0)
Platelets: 182 10*3/uL (ref 150–400)
RBC: 3.92 MIL/uL (ref 3.87–5.11)
RDW: 15.6 % — AB (ref 11.5–15.5)
WBC: 16.7 10*3/uL — ABNORMAL HIGH (ref 4.0–10.5)

## 2017-07-02 LAB — COMPREHENSIVE METABOLIC PANEL
ALK PHOS: 57 U/L (ref 38–126)
ALT: 12 U/L — ABNORMAL LOW (ref 14–54)
ANION GAP: 7 (ref 5–15)
AST: 24 U/L (ref 15–41)
Albumin: 2.8 g/dL — ABNORMAL LOW (ref 3.5–5.0)
BILIRUBIN TOTAL: 0.7 mg/dL (ref 0.3–1.2)
BUN: 8 mg/dL (ref 6–20)
CALCIUM: 8.9 mg/dL (ref 8.9–10.3)
CO2: 23 mmol/L (ref 22–32)
Chloride: 105 mmol/L (ref 101–111)
Creatinine, Ser: 0.65 mg/dL (ref 0.44–1.00)
GFR calc Af Amer: >60 mL/min (ref >60)
Glucose, Bld: 88 mg/dL (ref 65–99)
POTASSIUM: 3.8 mmol/L (ref 3.5–5.1)
Sodium: 135 mmol/L (ref 135–145)
TOTAL PROTEIN: 5.9 g/dL — AB (ref 6.5–8.1)

## 2017-07-02 NOTE — Progress Notes (Signed)
Samella ParrVerlesha N Sanon [id number] Postpartum Day 1 S/P Primary Cesarean Section due to NRFHT and IUGR  Subjective: Patient up ad lib, denies syncope or dizziness. Reports consuming regular diet without issues and denies N/V. Patient reports no bowel movement but is passing flatus.  Denies issues with urination and reports bleeding is "heavy."  Patient is breastfeeding and supplementing with hi-cal formular and reports going well.  Unsure of method for postpartum contraception.  Pain is being appropriately managed with use of motrin.   Objective: Temp:  [97.7 F (36.5 C)-98.6 F (37 C)] 98.3 F (36.8 C) (12/06 0550) Pulse Rate:  [59-96] 67 (12/06 0550) Resp:  [14-18] 16 (12/06 0550) BP: (114-150)/(63-91) 128/72 (12/06 0550) SpO2:  [95 %-99 %] 98 % (12/06 0550)  Recent Labs    06/29/17 2015 07/01/17 0754  HGB 11.3* 12.0  HCT 31.8* 33.4*  WBC 8.6 11.6*    Physical Exam:  General: alert, cooperative and no distress Mood/Affect: Appropriate/Appropriate Lungs: clear to auscultation, no wheezes, rales or rhonchi, symmetric air entry.  Heart: normal rate and regular rhythm. Breast: breasts appear normal, no suspicious masses, no skin or nipple changes or axillary nodes. Abdomen:  + bowel sounds, Soft, NT Incision: healing well, Honeycomb dressing and steri-strips removed and replaced Uterine Fundus: firm Lochia: appropriate Skin: Warm, Dry. DVT Evaluation: No cords or calf tenderness. No significant calf/ankle edema. JP drain:   None  Assessment Post Operative Day 1 S/P Primary C/S Normal Involution BreastFeeding Hemodynamically Stable  Plan: -Okay to shower today -Ambulate in hall as tolerated -Pain management as appropriate -Educated regarding wound/incision care -Continue other mgmt as ordered -Dr. Kathie RhodesS. Rivard to be updated on patient status  Cherre RobinsJessica L Cyril Railey MSN, CNM 07/02/2017, 6:19 AM

## 2017-07-03 ENCOUNTER — Encounter (HOSPITAL_COMMUNITY): Payer: Self-pay | Admitting: *Deleted

## 2017-07-03 MED ORDER — IBUPROFEN 600 MG PO TABS: 600.0000 mg | ORAL_TABLET | Freq: Four times a day (QID) | ORAL | 0 refills | Status: DC

## 2017-07-03 MED ORDER — OXYCODONE-ACETAMINOPHEN 5-325 MG PO TABS: 1.0000 | ORAL_TABLET | ORAL | 0 refills | Status: DC | PRN

## 2017-07-03 NOTE — Discharge Summary (Signed)
OB Discharge Summary     Patient Name: Stacy ParrVerlesha N [place] DOB: 1986-04-05 MRN: [id number]  Date of admission: 06/29/2017 Delivering MD: Essie HartPINN, WALDA   Date of discharge: 07/03/2017  Admitting diagnosis: INDUCTION Intrauterine pregnancy: 1648w6d     Secondary diagnosis:  Active Problems:   IUGR (intrauterine growth restriction) affecting care of mother   Oligohydramnios   Indication for care in labor or delivery  Additional problems: none     Discharge diagnosis: Term Pregnancy Delivered and oligo; IUGR                                                                                                Post partum procedures:  Augmentation: AROM, Pitocin, Cytotec and Foley Balloon  Complications: None  Hospital course:  Induction of Labor With Cesarean Section  31 y.o. yo G1P0 at 1848w6d was admitted to the hospital 06/29/2017 for induction of labor. Patient had a labor course significant for oligo iugr. The patient went for cesarean section due to fetal intolerance to labor, and delivered a Viable infant,@BABYSUPPRESS (DBLINK,ept,110,,1,,) Membrane Rupture Time/Date: )5:20 AM ,07/01/2017   @Details  of operation can be found in separate operative Note.  Patient had an uncomplicated postpartum course. She is ambulating, tolerating a regular diet, passing flatus, and urinating well.  Patient is discharged home in stable condition on 07/03/17.                                    Physical exam  Vitals:   07/02/17 0550 07/02/17 0945 07/02/17 1823 07/03/17 0600  BP: 128/72 (!) 115/57 134/78 139/84  Pulse: 67 68 67 75  Resp: 16 18 17 18   Temp: 98.3 F (36.8 C) 98.5 F (36.9 C) 98.1 F (36.7 C) 98.3 F (36.8 C)  TempSrc: Oral Oral Oral   SpO2: 98% 99%  100%  Weight:      Height:       General: alert, cooperative and no distress Lochia: appropriate Uterine Fundus: firm Incision: Dressing has small amount new drainage DVT Evaluation: No evidence of DVT seen on physical exam. Labs: Lab  Results  Component Value Date   WBC 16.7 (H) 07/02/2017   HGB 10.5 (L) 07/02/2017   HCT 29.1 (L) 07/02/2017   MCV 74.2 (L) 07/02/2017   PLT 182 07/02/2017   CMP Latest Ref Rng & Units 07/02/2017  Glucose 65 - 99 mg/dL 88  BUN 6 - 20 mg/dL 8  Creatinine 4.090.44 - 8.111.00 mg/dL 9.140.65  Sodium 782135 - 956145 mmol/L 135  Potassium 3.5 - 5.1 mmol/L 3.8  Chloride 101 - 111 mmol/L 105  CO2 22 - 32 mmol/L 23  Calcium 8.9 - 10.3 mg/dL 8.9  Total Protein 6.5 - 8.1 g/dL 5.9(L)  Total Bilirubin 0.3 - 1.2 mg/dL 0.7  Alkaline Phos 38 - 126 U/L 57  AST 15 - 41 U/L 24  ALT 14 - 54 U/L 12(L)    Discharge instruction: per After Visit Summary and "Baby and Me Booklet".  After visit meds:    Diet: routine diet  Activity: Advance as tolerated. Pelvic rest for 6 weeks.   Outpatient follow up:2 weeks Follow up Appt:No future appointments. Follow up Visit:No Follow-up on file.  Postpartum contraception: Not Discussed  Newborn Data: Live born female  Birth Weight: 4 lb 6.7 oz (2005 g) APGAR: 9, 9  Newborn Delivery   Birth date/time:  07/01/2017 10:14:00 Delivery type:  C-Section, Low Transverse C-section categorization:  Primary     Baby Feeding: Breast Disposition:home with mother   07/03/2017 1233 Rhea PinkLori A Langston [place], CNM

## 2017-07-03 NOTE — Plan of Care (Signed)
Pt. Had bm

## 2017-07-03 NOTE — Lactation Note (Signed)
This note was copied from a baby's chart. Lactation Consultation Note  Patient Name: Stacy Dani GobbleVerlesha Shepard FAOZH'YToday's Date: 07/03/2017  Mom and baby to be discharged today.  Mom states baby is latching well to both breasts.  Recently pumped 5 mls.  Baby also receiving neosure supplements.  Instructed to continue with post pumping and supplementation.  Message left for follow up outpatient lactation appointment next week.   Maternal Data    Feeding    LATCH Score                   Interventions    Lactation Tools Discussed/Used     Consult Status      Huston FoleyMOULDEN, Shahil Speegle S 07/03/2017, 2:49 PM

## 2017-07-03 NOTE — Discharge Instructions (Signed)

## 2017-11-04 ENCOUNTER — Encounter: Payer: Self-pay | Admitting: Physician Assistant

## 2020-11-29 ENCOUNTER — Other Ambulatory Visit: Payer: Self-pay

## 2020-11-29 ENCOUNTER — Ambulatory Visit: Payer: 59 | Admitting: Sports Medicine

## 2020-11-29 ENCOUNTER — Ambulatory Visit (INDEPENDENT_AMBULATORY_CARE_PROVIDER_SITE_OTHER): Payer: 59

## 2020-11-29 ENCOUNTER — Encounter: Payer: Self-pay | Admitting: Sports Medicine

## 2020-11-29 DIAGNOSIS — M722 Plantar fascial fibromatosis: Secondary | ICD-10-CM

## 2020-11-29 DIAGNOSIS — M2141 Flat foot [pes planus] (acquired), right foot: Secondary | ICD-10-CM | POA: Diagnosis not present

## 2020-11-29 DIAGNOSIS — M79671 Pain in right foot: Secondary | ICD-10-CM | POA: Diagnosis not present

## 2020-11-29 DIAGNOSIS — M79672 Pain in left foot: Secondary | ICD-10-CM

## 2020-11-29 DIAGNOSIS — M779 Enthesopathy, unspecified: Secondary | ICD-10-CM

## 2020-11-29 DIAGNOSIS — M2142 Flat foot [pes planus] (acquired), left foot: Secondary | ICD-10-CM

## 2020-11-29 MED ORDER — MELOXICAM 15 MG PO TABS: 15.0000 mg | ORAL_TABLET | Freq: Every day | ORAL | 0 refills | Status: DC

## 2020-11-29 MED ORDER — PREDNISONE 10 MG (21) PO TBPK
ORAL_TABLET | ORAL | 0 refills | Status: DC
Start: 2020-11-29 — End: 2021-09-26

## 2020-11-29 NOTE — Progress Notes (Signed)
Subjective: Stacy Shepard is a 35 y.o. female patient presents to office with complaint of moderate heel pain on the right greater than left.  Patient admits to post static dyskinesia for 8 months. Patient has treated this problem with compression socks and Dr. Margart Sickles insoles with relief and losing weight that has helped some reports that the right was much more painful and also she used to have pain on the lateral side of the left foot.  Patient reports that she works as a Engineer, civil (consulting) for [employer] and does a lot of walking and standing. Denies any other pedal complaints.   Review of system noncontributory  Patient Active Problem List   Diagnosis Date Noted  . IUGR (intrauterine growth restriction) affecting care of mother 06/29/2017  . Oligohydramnios 06/29/2017  . Indication for care in labor or delivery 06/29/2017  . 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 Fatigue 05/30/2016    Current Outpatient Medications on File Prior to Visit  Medication Sig Dispense Refill  . cetirizine (ZYRTEC ALLERGY) 10 MG tablet Take 1 tablet (10 mg total) by mouth daily. (Patient not taking: Reported on 06/30/2017) 30 tablet 0  . ibuprofen (ADVIL,MOTRIN) 600 MG tablet Take 1 tablet (600 mg total) by mouth every 6 (six) hours. 30 tablet 0  . oxyCODONE-acetaminophen (PERCOCET/ROXICET) 5-325 MG tablet Take 1 tablet by mouth every 4 (four) hours as needed (pain scale 4-7). 30 tablet 0  . Prenatal Vit-Fe Fumarate-FA (PRENATAL MULTIVITAMIN) TABS tablet Take 1 tablet by mouth daily at 12 noon.     No current facility-administered medications on file prior to visit.    No Known Allergies  Objective: Physical Exam General: The patient is alert and oriented x3 in no acute distress.  Dermatology: Skin is warm, dry and supple bilateral lower extremities. Nails 1-10 are normal. There is no erythema, edema, no eccymosis, no open lesions present. Integument is otherwise unremarkable.  Vascular: Dorsalis Pedis pulse and  Posterior Tibial pulse are 2/4 bilateral. Capillary fill time is immediate to all digits.  Neurological: Grossly intact to light touch bilateral.  Musculoskeletal: Tenderness to palpation at the medial calcaneal tubercale and through the insertion of the plantar fascia on the right no reproducible tenderness on left no pain with compression of calcaneus bilateral. No pain with tuning fork to calcaneus bilateral. No pain with calf compression bilateral. There is decreased Ankle joint range of motion bilateral. All other joints range of motion within normal limits bilateral. Strength 5/5 in all groups bilateral.  Pes planus foot type.  Gait: Unassisted, Antalgic avoid weight on heels  Xray, Right/Left foot:  Normal osseous mineralization. Joint spaces preserved except midtarsal joint supportive of pes planus deformity because of breech to this area. No fracture/dislocation/boney destruction.  Minimal calcaneal spur present with mild thickening of plantar fascia. No other soft tissue abnormalities or radiopaque foreign bodies.   Assessment and Plan: Problem List Items Addressed This Visit   None   Visit Diagnoses    Plantar fasciitis, bilateral    -  Primary   Relevant Orders   DG Foot Complete Right   DG Foot Complete Left   Heel pain, bilateral       Tendonitis       Pes planus of both feet          -Complete examination performed.  -Xrays reviewed -Discussed with patient in detail the condition of plantar fasciitis, how this occurs and general treatment options. Explained both conservative and surgical treatments.  -Rx Meloxicam to start after prednisone  dose pack is completed -Recommended good supportive shoes and advised use of OTC insert.  Shoe list provided -Explained to patient daily stretching exercises. -Recommend patient to ice affected area 1-2x daily. -Patient to return to office in 4 weeks for follow up or sooner if problems or questions arise.  Asencion Islam, DPM

## 2020-11-29 NOTE — Patient Instructions (Signed)

## 2020-12-07 ENCOUNTER — Other Ambulatory Visit: Payer: Self-pay | Admitting: Sports Medicine

## 2020-12-07 DIAGNOSIS — M2141 Flat foot [pes planus] (acquired), right foot: Secondary | ICD-10-CM

## 2020-12-07 DIAGNOSIS — M2142 Flat foot [pes planus] (acquired), left foot: Secondary | ICD-10-CM

## 2020-12-27 ENCOUNTER — Ambulatory Visit: Payer: 59 | Admitting: Sports Medicine

## 2020-12-27 ENCOUNTER — Encounter: Payer: Self-pay | Admitting: Sports Medicine

## 2020-12-27 ENCOUNTER — Other Ambulatory Visit: Payer: Self-pay

## 2020-12-27 DIAGNOSIS — M2141 Flat foot [pes planus] (acquired), right foot: Secondary | ICD-10-CM | POA: Diagnosis not present

## 2020-12-27 DIAGNOSIS — M779 Enthesopathy, unspecified: Secondary | ICD-10-CM

## 2020-12-27 DIAGNOSIS — M722 Plantar fascial fibromatosis: Secondary | ICD-10-CM

## 2020-12-27 DIAGNOSIS — M2142 Flat foot [pes planus] (acquired), left foot: Secondary | ICD-10-CM

## 2020-12-27 DIAGNOSIS — M79671 Pain in right foot: Secondary | ICD-10-CM

## 2020-12-27 DIAGNOSIS — M79672 Pain in left foot: Secondary | ICD-10-CM

## 2020-12-27 NOTE — Patient Instructions (Signed)

## 2020-12-27 NOTE — Progress Notes (Signed)
Subjective: Stacy Shepard is a 35 y.o. female patient returns to office for bilateral foot pain follow-up.  Patient reports that pain is doing better left is about 85% better and right is about 75% better states that she has changed her shoes which have helped greatly and has been doing stretches and change of her diet to help with inflammation.  Patient reports that she did not get the medication and start taking it she wants to try more natural route first.  Patient denies any other pedal complaints at this time.   Patient Active Problem List   Diagnosis Date Noted  . IUGR (intrauterine growth restriction) affecting care of mother 06/29/2017  . Oligohydramnios 06/29/2017  . Indication for care in labor or delivery 06/29/2017  . 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 Fatigue 05/30/2016    Current Outpatient Medications on File Prior to Visit  Medication Sig Dispense Refill  . cetirizine (ZYRTEC ALLERGY) 10 MG tablet Take 1 tablet (10 mg total) by mouth daily. (Patient not taking: Reported on 06/30/2017) 30 tablet 0  . ibuprofen (ADVIL,MOTRIN) 600 MG tablet Take 1 tablet (600 mg total) by mouth every 6 (six) hours. 30 tablet 0  . meloxicam (MOBIC) 15 MG tablet Take 1 tablet (15 mg total) by mouth daily. 30 tablet 0  . oxyCODONE-acetaminophen (PERCOCET/ROXICET) 5-325 MG tablet Take 1 tablet by mouth every 4 (four) hours as needed (pain scale 4-7). 30 tablet 0  . predniSONE (STERAPRED UNI-PAK 21 TAB) 10 MG (21) TBPK tablet Take as directed 21 tablet 0  . Prenatal Vit-Fe Fumarate-FA (PRENATAL MULTIVITAMIN) TABS tablet Take 1 tablet by mouth daily at 12 noon.     No current facility-administered medications on file prior to visit.    No Known Allergies  Objective: Physical Exam General: The patient is alert and oriented x3 in no acute distress.  Dermatology: Skin is warm, dry and supple bilateral lower extremities. Nails 1-10 are normal. There is no erythema, edema, no eccymosis,  no open lesions present. Integument is otherwise unremarkable.  Vascular: Dorsalis Pedis pulse and Posterior Tibial pulse are 2/4 bilateral. Capillary fill time is immediate to all digits.  Neurological: Grossly intact to light touch bilateral.  Musculoskeletal: No significant reproducible tenderness to palpation at the medial calcaneal tubercale and through the insertion of the plantar fascia on the right no reproducible tenderness on left no pain with compression of calcaneus bilateral. No pain with tuning fork to calcaneus bilateral. No pain with calf compression bilateral. There is decreased Ankle joint range of motion bilateral. All other joints range of motion within normal limits bilateral. Strength 5/5 in all groups bilateral.  Pes planus foot type.   Assessment and Plan: Problem List Items Addressed This Visit   None   Visit Diagnoses    Plantar fasciitis, bilateral    -  Primary   Heel pain, bilateral       Tendonitis       Pes planus of both feet          -Complete examination performed.  -Re-Discussed with patient in detail the condition of plantar fasciitis/tendinitis and continued care -Advised patient to continue with good supportive shoes; updated shoe was provided -Continue with gentle stretching and icing until symptoms have completely resolved -Advised patient if pain is not relieved with her current diet and regiment to pick up her meloxicam to start after prednisone dose pack is completed -Patient to return to office as needed or sooner if problems or questions arise.  Vito Beg  Cannon Kettle, DPM

## 2021-09-25 ENCOUNTER — Encounter (HOSPITAL_BASED_OUTPATIENT_CLINIC_OR_DEPARTMENT_OTHER): Payer: Self-pay | Admitting: Urology

## 2021-09-25 ENCOUNTER — Emergency Department (HOSPITAL_BASED_OUTPATIENT_CLINIC_OR_DEPARTMENT_OTHER)
Admission: EM | Admit: 2021-09-25 | Discharge: 2021-09-26 | Disposition: A | Discharge status: Home / Self Care | Payer: 59 | Attending: Emergency Medicine | Admitting: Emergency Medicine

## 2021-09-25 ENCOUNTER — Other Ambulatory Visit: Payer: Self-pay

## 2021-09-25 DIAGNOSIS — J45909 Unspecified asthma, uncomplicated: Secondary | ICD-10-CM | POA: Diagnosis not present

## 2021-09-25 DIAGNOSIS — R519 Headache, unspecified: Secondary | ICD-10-CM | POA: Diagnosis present

## 2021-09-25 DIAGNOSIS — U071 COVID-19: Secondary | ICD-10-CM

## 2021-09-25 LAB — RESP PANEL BY RT-PCR (FLU A&B, COVID) ARPGX2
Influenza A by PCR: NEGATIVE
Influenza B by PCR: NEGATIVE
SARS Coronavirus 2 by RT PCR: POSITIVE — AB

## 2021-09-25 NOTE — ED Triage Notes (Signed)
Fatigue, headache, body aches since Saturday  ?States congestion started today  ?Denies N/V, states fever intermittent  ?H/o asthma  ?

## 2021-09-26 ENCOUNTER — Emergency Department (HOSPITAL_BASED_OUTPATIENT_CLINIC_OR_DEPARTMENT_OTHER): Payer: 59

## 2021-09-26 MED ORDER — ALBUTEROL SULFATE HFA 108 (90 BASE) MCG/ACT IN AERS
2.0000 | INHALATION_SPRAY | RESPIRATORY_TRACT | Status: DC | PRN
Start: 2021-09-26 — End: 2021-09-26
  Filled 2021-09-26: qty 6.7

## 2021-09-26 NOTE — ED Notes (Signed)
Pt A&OX4 ambulatory at d/c with indepndent steady gait, NAD. Pt verbalized understanding of d/c instructions and follow up care. ?

## 2021-09-26 NOTE — ED Provider Notes (Signed)
? ?[facility] ?Provider Note: Lowella Dell, MD, FACEP ? ?CSN: [id number] ?MRN: [id number] ?ARRIVAL: 09/25/21 at 2223 ?ROOM: [facility] ? ? ?CHIEF COMPLAINT  ?flu like symptoms  ? ? ?HISTORY OF PRESENT ILLNESS  ?09/26/21 12:09 AM ?Stacy Shepard is a 36 y.o. female who has had fatigue, headache, body aches and intermittent fever up to 102 for the past 5 days.  Her symptoms worsened yesterday with productive cough and chest tightness consistent with her history of asthma.  She does not currently have an inhaler.  Symptoms are not severe at this point.  She rates her headache as a 3 out of 10.  She denies nausea or vomiting ? ? ?Past Medical History:  ?Diagnosis Date  ? Asthma   ? ? ?Past Surgical History:  ?Procedure Laterality Date  ? CESAREAN SECTION N/A 07/01/2017  ? Procedure: CESAREAN SECTION;  Surgeon: Essie Hart, MD;  Location: [facility] BIRTHING SU[facility];  Service: Obstetrics;  Laterality: N/A;  ? ? ?Family History  ?Problem Relation Age of Onset  ? Cancer Mother   ? Diabetes Mother   ? Hypertension Mother   ? Hypertension Sister   ? Cancer Brother   ? Stroke Maternal Grandmother   ? Hypertension Maternal Grandfather   ? ? ?Social History  ? ?Tobacco Use  ? Smoking status: Never  ? Smokeless tobacco: Never  ?Substance Use Topics  ? Alcohol use: Yes  ?  Alcohol/week: 1.0 standard drink  ?  Types: 1 drink(s) per week  ? Drug use: No  ? ? ?Prior to Admission medications   ?Medication Sig Start Date End Date Taking? Authorizing Provider  ?cetirizine (ZYRTEC ALLERGY) 10 MG tablet Take 1 tablet (10 mg total) by mouth daily. ?Patient not taking: Reported on 06/30/2017 12/27/16   Emi Holes, PA-C  ?ibuprofen (ADVIL,MOTRIN) 600 MG tablet Take 1 tablet (600 mg total) by mouth every 6 (six) hours. 07/03/17   Clemmons, Elmore Guise, CNM  ?meloxicam (MOBIC) 15 MG tablet Take 1 tablet (15 mg total) by mouth daily. 11/29/20   Asencion Islam, DPM  ?oxyCODONE-acetaminophen (PERCOCET/ROXICET) 5-325 MG tablet Take 1 tablet by  mouth every 4 (four) hours as needed (pain scale 4-7). 07/03/17   Clemmons, Elmore Guise, CNM  ?predniSONE (STERAPRED UNI-PAK 21 TAB) 10 MG (21) TBPK tablet Take as directed 11/29/20   Asencion Islam, DPM  ?Prenatal Vit-Fe Fumarate-FA (PRENATAL MULTIVITAMIN) TABS tablet Take 1 tablet by mouth daily at 12 noon.    [provider]  ? ? ?Allergies ?Patient has no known allergies. ? ? ?REVIEW OF SYSTEMS  ?Negative except as noted here or in the History of Present Illness. ? ? ?PHYSICAL EXAMINATION  ?Initial Vital Signs ?Blood pressure 128/89, pulse 85, temperature 99.3 ?F (37.4 ?C), temperature source Oral, resp. rate 18, height 5\' 8"  (1.727 m), weight 99.8 kg, last menstrual period 09/10/2021, SpO2 100 %, unknown if currently breastfeeding. ? ?Examination ?General: Well-developed, well-nourished female in no acute distress; appearance consistent with age of record ?HENT: normocephalic; atraumatic ?Eyes: Normal appearance ?Neck: supple ?Heart: regular rate and rhythm ?Lungs: clear to auscultation bilaterally ?Abdomen: soft; nondistended; nontender; bowel sounds present ?Extremities: No deformity; full range of motion ?Neurologic: Awake, alert and oriented; motor function intact in all extremities and symmetric; no facial droop ?Skin: Warm and dry ?Psychiatric: Normal mood and affect ? ? ?RESULTS  ?Summary of this visit's results, reviewed and interpreted by myself: ? ? EKG Interpretation ? ?Date/Time:    ?Ventricular Rate:    ?PR Interval:    ?  QRS Duration:   ?QT Interval:    ?QTC Calculation:   ?R Axis:     ?Text Interpretation:   ?  ? ?  ? ?Laboratory Studies: ?Results for orders placed or performed during the hospital encounter of 09/25/21 (from the past 24 hour(s))  ?Resp Panel by RT-PCR (Flu A&B, Covid) Nasopharyngeal Swab     Status: Abnormal  ? Collection Time: 09/25/21 10:35 PM  ? Specimen: Nasopharyngeal Swab; Nasopharyngeal(NP) swabs in vial transport medium  ?Result Value Ref Range  ? SARS Coronavirus 2 by  RT PCR POSITIVE (A) NEGATIVE  ? Influenza A by PCR NEGATIVE NEGATIVE  ? Influenza B by PCR NEGATIVE NEGATIVE  ? ?Imaging Studies: ?DG Chest Port 1 View ? ?Result Date: 09/26/2021 ?CLINICAL DATA:  Shortness of breath and cough. EXAM: PORTABLE CHEST 1 VIEW COMPARISON:  Chest radiograph dated 05/13/2014. FINDINGS: The heart size and mediastinal contours are within normal limits. Both lungs are clear. The visualized skeletal structures are unremarkable. IMPRESSION: No active disease. Electronically Signed   By: Elgie Collard M.D.   On: 09/26/2021 00:34   ? ?ED COURSE and MDM  ?Nursing notes, initial and subsequent vitals signs, including pulse oximetry, reviewed and interpreted by myself. ? ?Vitals:  ? 09/25/21 2232 09/25/21 2235  ?BP:  128/89  ?Pulse:  85  ?Resp:  18  ?Temp:  99.3 ?F (37.4 ?C)  ?TempSrc:  Oral  ?SpO2:  100%  ?Weight: 99.8 kg   ?Height: 5\' 8"  (1.727 m)   ? ?Medications  ?albuterol (VENTOLIN HFA) 108 (90 Base) MCG/ACT inhaler 2 puff (has no administration in time range)  ? ?Patient is positive for COVID.  She has a history of asthma and her symptoms suggest the COVID has triggered some bronchospasm.  We will refill her albuterol inhaler.  I do not believe antibiotics are indicated at this time and she is out of the window for COVID antivirals. ? ?PROCEDURES  ?Procedures ? ? ?ED DIAGNOSES  ? ?  ICD-10-CM   ?1. COVID-19 virus infection  U07.1   ?  ? ? ? ?  ? , MD ?09/26/21 11/26/21 ? ?

## 2023-06-05 IMAGING — DX DG CHEST 1V PORT
1 series · 1 of 1 positions shown · non-contrast
Comparison: Chest radiograph dated 05/13/2014.

CLINICAL DATA: Shortness of breath and cough.

EXAM:
PORTABLE CHEST 1 VIEW

[chest ap]
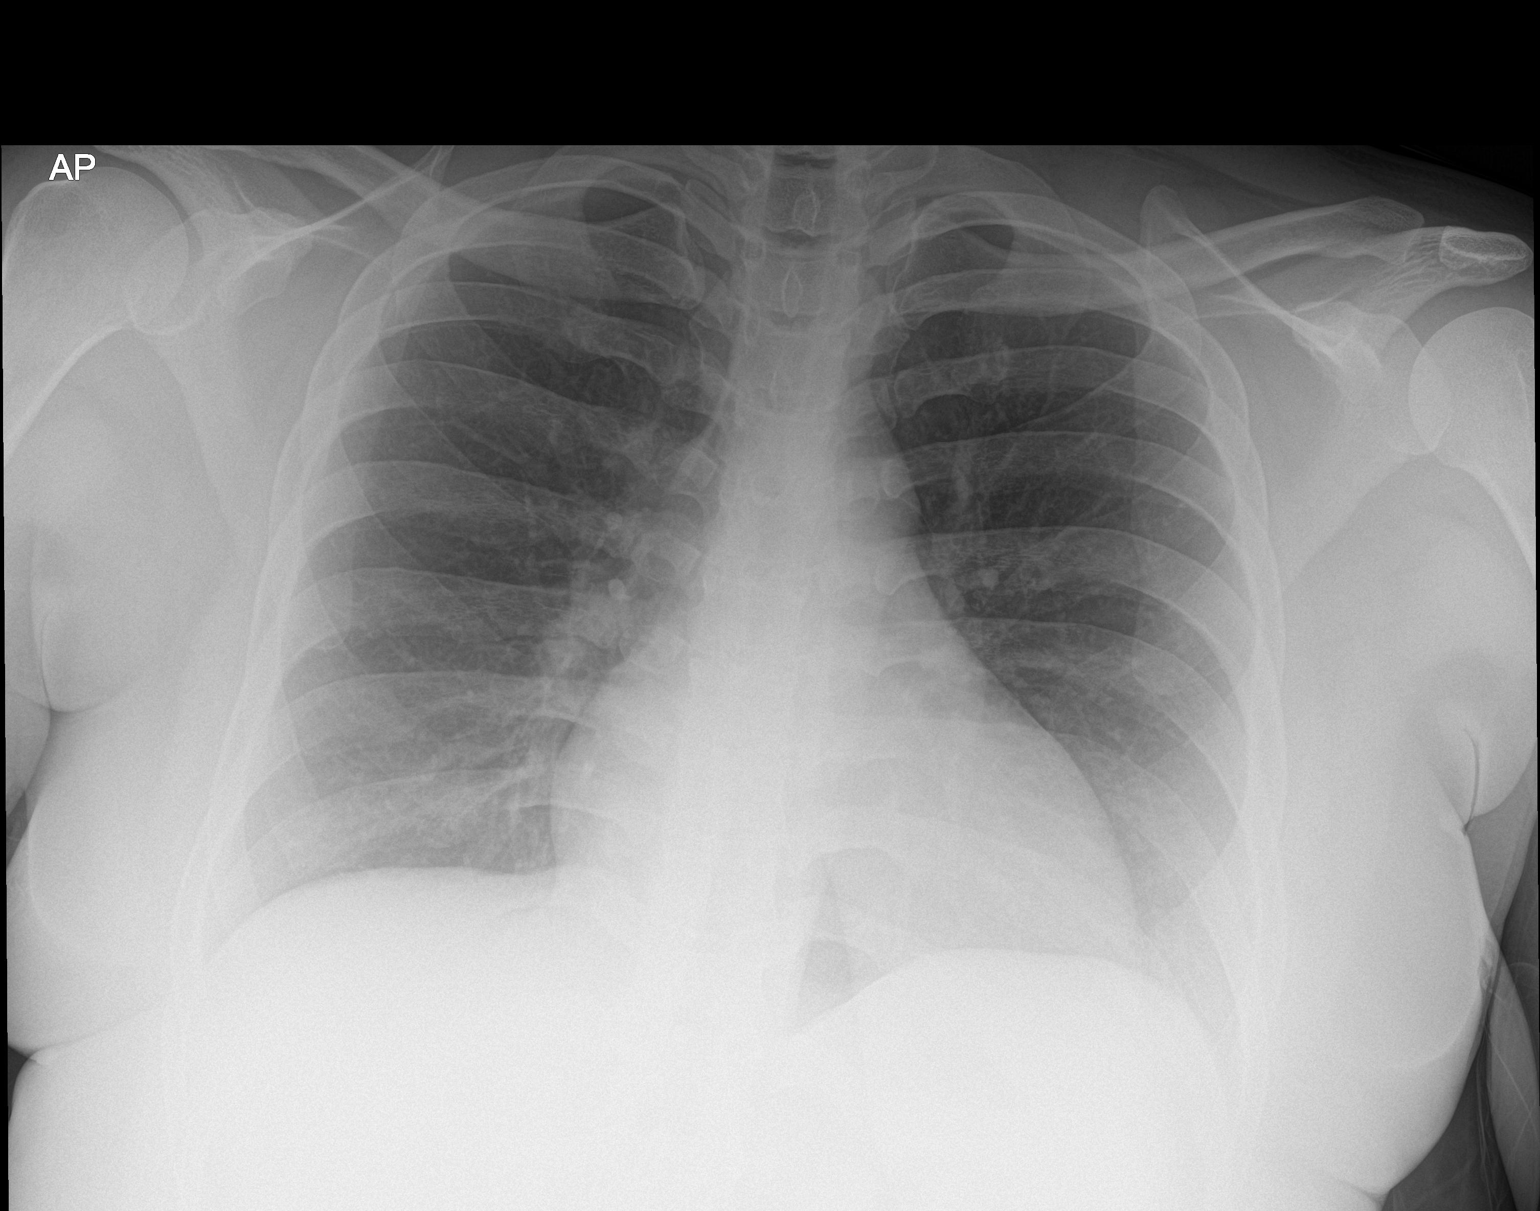

[1 of 1 positions shown; findings below may reference images not displayed]

FINDINGS: The heart size and mediastinal contours are within normal limits.
Both lungs are clear. The visualized skeletal structures are
unremarkable.
IMPRESSION: No active disease.
